# Patient Record
Sex: Female | Born: 1960 | Race: White | Hispanic: No | Marital: Married
Health system: Midwestern US, Academic
[De-identification: ages and names within clinical notes are randomized; demographics above are authoritative.]

---

## 2017-03-02 ENCOUNTER — Encounter: Admit: 2017-03-02 | Discharge: 2017-03-02

## 2017-03-02 MED ORDER — CARVEDILOL 12.5 MG PO TAB
ORAL_TABLET | Freq: Two times a day (BID) | ORAL | 2 refills | 90.00000 days | Status: AC
Start: 2017-03-02 — End: 2017-12-07

## 2017-03-05 ENCOUNTER — Encounter: Admit: 2017-03-05 | Discharge: 2017-03-05

## 2017-03-05 DIAGNOSIS — I10 Essential (primary) hypertension: Principal | ICD-10-CM

## 2017-03-05 NOTE — Telephone Encounter
FLP/BMP lab orders mailed to patient's home today, along with lab request letter.

## 2017-09-03 ENCOUNTER — Encounter: Admit: 2017-09-03 | Discharge: 2017-09-03

## 2017-09-03 MED ORDER — HYDROCHLOROTHIAZIDE 25 MG PO TAB
25 mg | ORAL_TABLET | Freq: Every day | ORAL | 1 refills | 28.00000 days | Status: AC
Start: 2017-09-03 — End: 2018-03-04

## 2017-09-04 ENCOUNTER — Encounter: Admit: 2017-09-04 | Discharge: 2017-09-04

## 2017-09-04 MED ORDER — ATORVASTATIN 10 MG PO TAB
10 mg | ORAL_TABLET | Freq: Every day | ORAL | 1 refills | Status: AC
Start: 2017-09-04 — End: 2018-03-04

## 2017-11-25 ENCOUNTER — Encounter: Admit: 2017-11-25 | Discharge: 2017-11-25

## 2017-11-25 DIAGNOSIS — I10 Essential (primary) hypertension: ICD-10-CM

## 2017-11-25 DIAGNOSIS — E782 Mixed hyperlipidemia: Principal | ICD-10-CM

## 2017-12-03 ENCOUNTER — Encounter: Admit: 2017-12-03 | Discharge: 2017-12-03

## 2017-12-03 DIAGNOSIS — E782 Mixed hyperlipidemia: Principal | ICD-10-CM

## 2017-12-03 DIAGNOSIS — I10 Essential (primary) hypertension: ICD-10-CM

## 2017-12-03 LAB — BASIC METABOLIC PANEL
Lab: 0.7
Lab: 104
Lab: 13
Lab: 9.5
Lab: 96
Lab: 139
Lab: 17
Lab: 26
Lab: 3.9
Lab: 86

## 2017-12-03 LAB — LIPID PROFILE
Lab: 51
Lab: 82
Lab: 116 — ABNORMAL HIGH
Lab: 184

## 2017-12-07 ENCOUNTER — Encounter: Admit: 2017-12-07 | Discharge: 2017-12-07

## 2017-12-07 ENCOUNTER — Ambulatory Visit: Admit: 2017-12-07 | Discharge: 2017-12-08 | Payer: Commercial Managed Care - PPO

## 2017-12-07 DIAGNOSIS — I10 Essential (primary) hypertension: Principal | ICD-10-CM

## 2017-12-07 DIAGNOSIS — K219 Gastro-esophageal reflux disease without esophagitis: ICD-10-CM

## 2017-12-07 DIAGNOSIS — E782 Mixed hyperlipidemia: ICD-10-CM

## 2017-12-07 DIAGNOSIS — E7800 Pure hypercholesterolemia, unspecified: ICD-10-CM

## 2017-12-07 DIAGNOSIS — N644 Mastodynia: ICD-10-CM

## 2017-12-07 DIAGNOSIS — G43909 Migraine, unspecified, not intractable, without status migrainosus: ICD-10-CM

## 2017-12-07 MED ORDER — CARVEDILOL 12.5 MG PO TAB
12.5 mg | ORAL_TABLET | Freq: Two times a day (BID) | ORAL | 3 refills | 90.00000 days | Status: AC
Start: 2017-12-07 — End: 2018-12-17

## 2018-01-13 ENCOUNTER — Ambulatory Visit: Admit: 2018-01-13 | Discharge: 2018-01-13 | Payer: Commercial Managed Care - PPO

## 2018-03-04 ENCOUNTER — Encounter: Admit: 2018-03-04 | Discharge: 2018-03-04

## 2018-03-04 MED ORDER — ATORVASTATIN 10 MG PO TAB
ORAL_TABLET | Freq: Every day | 3 refills | Status: AC
Start: 2018-03-04 — End: 2019-03-22

## 2018-03-04 MED ORDER — HYDROCHLOROTHIAZIDE 25 MG PO TAB
ORAL_TABLET | Freq: Every day | ORAL | 3 refills | 28.00000 days | Status: AC
Start: 2018-03-04 — End: 2019-03-22

## 2018-03-25 ENCOUNTER — Encounter: Admit: 2018-03-25 | Discharge: 2018-03-25

## 2018-12-17 ENCOUNTER — Encounter: Admit: 2018-12-17 | Discharge: 2018-12-17

## 2018-12-17 MED ORDER — CARVEDILOL 12.5 MG PO TAB
ORAL_TABLET | Freq: Two times a day (BID) | ORAL | 0 refills | 90.00000 days | Status: DC
Start: 2018-12-17 — End: 2019-03-22

## 2019-02-04 ENCOUNTER — Encounter: Admit: 2019-02-04 | Discharge: 2019-02-04

## 2019-03-22 ENCOUNTER — Encounter: Admit: 2019-03-22 | Discharge: 2019-03-22

## 2019-03-22 MED ORDER — HYDROCHLOROTHIAZIDE 25 MG PO TAB
ORAL_TABLET | Freq: Every day | ORAL | 0 refills | 28.00000 days | Status: DC
Start: 2019-03-22 — End: 2019-06-20

## 2019-03-22 MED ORDER — CARVEDILOL 12.5 MG PO TAB
ORAL_TABLET | Freq: Two times a day (BID) | ORAL | 0 refills | 90.00000 days | Status: DC
Start: 2019-03-22 — End: 2019-06-20

## 2019-03-22 MED ORDER — ATORVASTATIN 10 MG PO TAB
ORAL_TABLET | Freq: Every day | 0 refills | Status: DC
Start: 2019-03-22 — End: 2019-06-20

## 2019-03-24 ENCOUNTER — Ambulatory Visit: Admit: 2019-03-24 | Discharge: 2019-03-24

## 2019-03-24 ENCOUNTER — Encounter: Admit: 2019-03-24 | Discharge: 2019-03-24

## 2019-03-24 DIAGNOSIS — I10 Essential (primary) hypertension: Secondary | ICD-10-CM

## 2019-03-24 DIAGNOSIS — N644 Mastodynia: Secondary | ICD-10-CM

## 2019-03-24 DIAGNOSIS — E782 Mixed hyperlipidemia: Secondary | ICD-10-CM

## 2019-03-24 DIAGNOSIS — E7800 Pure hypercholesterolemia, unspecified: Secondary | ICD-10-CM

## 2019-03-24 DIAGNOSIS — G43909 Migraine, unspecified, not intractable, without status migrainosus: Secondary | ICD-10-CM

## 2019-03-24 DIAGNOSIS — K219 Gastro-esophageal reflux disease without esophagitis: Secondary | ICD-10-CM

## 2019-03-24 NOTE — Progress Notes
Telehealth Visit Note    Date of Service: 03/24/2019    Subjective:      Obtained patient's verbal consent to treat them and their agreement to [facility] financial policy and NPP via this telehealth visit during the [facility] Emergency       Roberta Manning is a 58 y.o. female.    History of Present Illness    Roberta Manning is a 58 year old female who follows with Dr. Paris Lore.  She has a history of hypertension, migraines, GERD, and insulin resistance on Metformin.   She last saw Dr. Paris Lore in May, 2019.  She postponed her annual follow-up in May, due to COVID-19.  I am seeing her today for a telehealth visit.    She hasn't been monitoring her blood pressure at home. She says the bottom number has been creeping up the last couple of years and is frequently 90s. She says her heart rate on her Apple watch while we were talking was 58 bpm.     Her PCP is [facility] and has had lab work recently.     She denies any chest pain, shortness of breath, palpitations.        Review of Systems   Constitutional: Negative.    HENT: Negative.    Eyes: Negative.    Respiratory: Negative.    Cardiovascular: Negative.    Gastrointestinal: Negative.    Endocrine: Negative.    Genitourinary: Negative.    Musculoskeletal: Negative.    Skin: Negative.    Allergic/Immunologic: Negative.    Neurological: Negative.    Hematological: Negative.    Psychiatric/Behavioral: Negative.          Objective:         ??? acetaminophen (TYLENOL EXTRA STRENGTH) 500 mg tablet Take 500 mg by mouth Every 6 Hours as needed for Pain. For headache    ??? atorvastatin (LIPITOR) 10 mg tablet TAKE 1 TABLET BY MOUTH ONCE DAILY (PLEASE KEEP APPOINTMENT)   ??? carvediloL (COREG) 12.5 mg tablet TAKE 1 TABLET BY MOUTH TWICE DAILY WITH MEALS/FOOD   ??? CHOLECALCIFEROL (VITAMIN D3) (VITAMIN D3 PO) Take  by mouth.   ??? cyanocobalamin (vitamin B-12) (VITAMIN B-12) 1,000 mcg/mL drop Take  by mouth. ??? hydroCHLOROthiazide (HYDRODIURIL) 25 mg tablet Take 1 tablet by mouth once daily   ??? Magnesium 200 mg tab Take 400 mg by mouth daily.   ??? metFORMIN (GLUCOPHAGE) 500 mg tablet Take 500 mg by mouth daily with breakfast.   ??? riboflavin(+) (VITAMIN B-2) 100 mg tab Take 400 mg by mouth daily.   ??? trazodone (DESYREL) 50 mg tablet Take 25 mg by mouth at bedtime daily.     There were no vitals filed for this visit.  There is no height or weight on file to calculate BMI.     Telehealth Patient Reported Vitals     Row Name 03/24/19 1140                Weight:  65.8 kg (145 lb)        Height:  1.575 m (5' 2)        Pain Score:  Zero              Physical Exam      General Appearance: well developed, no acute distress   Breathing: unlabored   Skin: appears pink and dry   Neurologic Exam: neurological assessment grossly intact   Psychiatric:  Oriented, calm, and cooperative  Assessment and Plan:    Hypertension  -I asked her to obtain a [employer] blood pressure cuff and monitor blood pressure at home.  Recommend she take it before she takes her blood pressure medicine and then 2 to 3 hours after she takes her blood pressure medicine for the next couple of weeks so we can get a idea about her control.  -She is on carvedilol 12.5 mg twice daily but her heart rate is 58 so I will be unable to uptitrate this  -she can continue hydrochlorothiazide.  If I needed to add on more medication I would start with losartan 25 mg daily.  She said she had a reaction in the past to clonidine with itchy red skin  -She is going to call in her blood pressures to my chart    Dyslipidemia  -Last lipid profile in our system was Dec 02, 2017 her LDL is 116 HDL was 51, and triglycerides 82.  -She says she had more recent labs drawn at her primary care physician's office.  We appreciate a copy for our records  -He is on atorvastatin 10 mg daily.  She is also prediabetic we recommend aggressive control of her cholesterol to modify her risk factors Plan: She is going to call him with her blood pressure in a couple of weeks.  Would like to try to obtain a copy of her most recent lipid levels from her primary care physician.  Recommend she follow-up with Dr. Paris Lore in 1 year, or sooner if needed.  I would be happy to see her.                Elinor Parkinson, APRN-C  Interventional Cardiology  Pager [phone]            I spent 25 minutes spent on this patient's encounter with counseling and coordination of care taking >50% of the visit.

## 2019-06-18 ENCOUNTER — Encounter: Admit: 2019-06-18 | Discharge: 2019-06-18

## 2019-06-20 MED ORDER — HYDROCHLOROTHIAZIDE 25 MG PO TAB
ORAL_TABLET | Freq: Every day | ORAL | 3 refills | 28.00000 days | Status: AC
Start: 2019-06-20 — End: ?

## 2019-06-20 MED ORDER — CARVEDILOL 12.5 MG PO TAB
ORAL_TABLET | Freq: Two times a day (BID) | ORAL | 3 refills | 90.00000 days | Status: AC
Start: 2019-06-20 — End: ?

## 2019-06-20 MED ORDER — ATORVASTATIN 10 MG PO TAB
10 mg | ORAL_TABLET | Freq: Every day | ORAL | 3 refills | 90.00000 days | Status: AC
Start: 2019-06-20 — End: ?

## 2020-06-24 ENCOUNTER — Encounter: Admit: 2020-06-24 | Discharge: 2020-06-24

## 2020-06-24 MED ORDER — HYDROCHLOROTHIAZIDE 25 MG PO TAB
ORAL_TABLET | Freq: Every day | 0 refills
Start: 2020-06-24 — End: ?

## 2020-06-24 MED ORDER — CARVEDILOL 12.5 MG PO TAB
ORAL_TABLET | Freq: Two times a day (BID) | 0 refills
Start: 2020-06-24 — End: ?

## 2020-06-25 ENCOUNTER — Encounter: Admit: 2020-06-25 | Discharge: 2020-06-25

## 2020-06-25 DIAGNOSIS — I10 Essential (primary) hypertension: Secondary | ICD-10-CM

## 2020-08-22 ENCOUNTER — Encounter: Admit: 2020-08-22 | Discharge: 2020-08-22

## 2020-08-28 ENCOUNTER — Encounter: Admit: 2020-08-28 | Discharge: 2020-08-28 | Payer: BC Managed Care – HMO

## 2020-08-28 MED ORDER — CARVEDILOL 12.5 MG PO TAB
ORAL_TABLET | Freq: Two times a day (BID) | 0 refills
Start: 2020-08-28 — End: ?

## 2020-08-28 MED ORDER — CARVEDILOL 12.5 MG PO TAB
12.5 mg | ORAL_TABLET | Freq: Two times a day (BID) | ORAL | 1 refills | 90.00000 days | Status: AC
Start: 2020-08-28 — End: ?

## 2020-08-28 MED ORDER — HYDROCHLOROTHIAZIDE 25 MG PO TAB
25 mg | ORAL_TABLET | Freq: Every day | ORAL | 1 refills | 28.00000 days | Status: AC
Start: 2020-08-28 — End: ?

## 2020-08-28 MED ORDER — HYDROCHLOROTHIAZIDE 25 MG PO TAB
ORAL_TABLET | Freq: Every day | 0 refills
Start: 2020-08-28 — End: ?

## 2020-09-19 ENCOUNTER — Encounter

## 2020-09-19 DIAGNOSIS — Z79899 Other long term (current) drug therapy: Secondary | ICD-10-CM

## 2020-09-19 DIAGNOSIS — I10 Essential (primary) hypertension: Secondary | ICD-10-CM

## 2020-09-19 DIAGNOSIS — E782 Mixed hyperlipidemia: Secondary | ICD-10-CM

## 2020-09-19 LAB — COMPREHENSIVE METABOLIC PANEL
Lab: 0.7
Lab: 0.7
Lab: 105
Lab: 14
Lab: 140
Lab: 15
Lab: 19
Lab: 24
Lab: 25
Lab: 4
Lab: 4
Lab: 62
Lab: 7.1
Lab: 88
Lab: 9.9
Lab: 95

## 2020-09-19 LAB — LIPID PROFILE
Lab: 110
Lab: 209 — ABNORMAL HIGH
Lab: 22
Lab: 291 — ABNORMAL HIGH
Lab: 60

## 2020-09-20 ENCOUNTER — Encounter

## 2020-09-20 DIAGNOSIS — Z79899 Other long term (current) drug therapy: Secondary | ICD-10-CM

## 2020-09-20 DIAGNOSIS — E782 Mixed hyperlipidemia: Secondary | ICD-10-CM

## 2020-09-20 MED ORDER — ROSUVASTATIN 20 MG PO TAB
20 mg | ORAL_TABLET | Freq: Every day | ORAL | 1 refills | 90.00000 days | Status: AC
Start: 2020-09-20 — End: ?

## 2020-09-20 MED ORDER — EZETIMIBE 10 MG PO TAB
10 mg | ORAL_TABLET | Freq: Every day | ORAL | 1 refills | Status: AC
Start: 2020-09-20 — End: ?

## 2020-09-20 NOTE — Telephone Encounter
Reviewed with Dr. Paris Lore.    Connected with pt.  Informed pt that Dr. Paris Lore wants her to stop taking the Lipitor and start taking Crestor 20mg  daily at night and Zetia 10mg  daily and to recheck FLP and LFT in 3 months. Patient verbalized understanding and does not have any further questions or concerns.  Patient has our contact information for future needs

## 2020-09-20 NOTE — Telephone Encounter
-----   Message from Vevelyn Royals, RN sent at 09/19/2020  9:17 AM CST -----  Elevated lipid profile. Currently taking atorvastatin 10 mg daily. Please advise.

## 2020-10-08 ENCOUNTER — Encounter: Admit: 2020-10-08 | Discharge: 2020-10-08 | Payer: BC Managed Care – HMO

## 2020-10-08 ENCOUNTER — Ambulatory Visit: Admit: 2020-10-08 | Discharge: 2020-10-09 | Payer: BC Managed Care – HMO

## 2020-10-08 DIAGNOSIS — N644 Mastodynia: Secondary | ICD-10-CM

## 2020-10-08 DIAGNOSIS — I10 Essential (primary) hypertension: Secondary | ICD-10-CM

## 2020-10-08 DIAGNOSIS — E782 Mixed hyperlipidemia: Secondary | ICD-10-CM

## 2020-10-08 DIAGNOSIS — K219 Gastro-esophageal reflux disease without esophagitis: Secondary | ICD-10-CM

## 2020-10-08 DIAGNOSIS — E7800 Pure hypercholesterolemia, unspecified: Secondary | ICD-10-CM

## 2020-10-08 DIAGNOSIS — G43909 Migraine, unspecified, not intractable, without status migrainosus: Secondary | ICD-10-CM

## 2020-10-08 NOTE — Progress Notes
Telehealth Visit Note    Date of Service: 10/08/2020    Subjective:      Obtained patient's verbal consent to treat them and their agreement to [facility] financial policy and NPP via this telehealth visit during the [facility] Emergency       Roberta Manning is a 60 y.o. female.    History of Present Illness  Roberta Manning is a 60 year old female with a history of dyslipidemia and hypertension.  Her blood pressures have really been well controlled over the last several years on carvedilol and hydrochlorothiazide.  She does have a family history of coronary artery disease and her cholesterols have been markedly elevated in the past.  She has done very well with atorvastatin however had not been taking it for little while and her repeat cholesterol was quite elevated.  She is now going to be going back on rosuvastatin and Zetia.  She denies any new exertional symptoms of chest discomfort or shortness of breath and really from a cardiovascular standpoint is gotten along well with no other new subjective complaints.       Review of Systems   Constitutional: Negative.    HENT: Negative.    Eyes: Negative.    Respiratory: Negative.    Cardiovascular: Negative.    Gastrointestinal: Negative.    Endocrine: Negative.    Genitourinary: Negative.    Musculoskeletal: Negative.    Skin: Negative.    Allergic/Immunologic: Negative.    Neurological: Negative.    Hematological: Negative.    Psychiatric/Behavioral: Negative.          Objective:         ? carvediloL (COREG) 12.5 mg tablet Take one tablet by mouth twice daily with meals. Take with food.   ? CHOLECALCIFEROL (VITAMIN D3) (VITAMIN D3 PO) Take  by mouth.   ? cyanocobalamin (vitamin B-12) (VITAMIN B-12 PO) Take  by mouth daily.   ? ezetimibe (ZETIA) 10 mg tablet Take one tablet by mouth daily.   ? hydroCHLOROthiazide (HYDRODIURIL) 25 mg tablet Take one tablet by mouth daily.   ? ibuprofen (ADVIL) 200 mg tablet Take 200 mg by mouth as Needed for Pain. Take with food. ? Magnesium 200 mg tab Take 400 mg by mouth daily.   ? metFORMIN (GLUCOPHAGE) 500 mg tablet Take 500 mg by mouth daily with breakfast.   ? riboflavin(+) (VITAMIN B-2) 100 mg tab Take 400 mg by mouth daily.   ? rosuvastatin (CRESTOR) 20 mg tablet Take one tablet by mouth daily.   ? trazodone (DESYREL) 50 mg tablet Take 25 mg by mouth at bedtime daily.          Telehealth Patient Reported Vitals     Row Name 10/08/20 1600                Weight: 61.2 kg (135 lb)        Height: 160 cm (5' 3)        Pain Score: Zero                  Telehealth Body Mass Index: 23.91 at 10/08/2020  4:34 PM    Physical Exam  General Appearance: alert and oriented, no acute distress  Skin: Limited visual exam, however no obvious ulcers or discoloration  Head: normocephalic, symmetric  Eyes: Grossly the EOMI and pupils are normal in size, sclera are clear and without icterus  ENT: unremarkable, nares patent - no obvious visual abnormalities  Neck Veins: neck veins are not visibly  distended  Extremities: no obvious visual lower extremity edema  Muskuloskeletal: no obvious visible deformity  Neurologic Exam: limited visual observation - neurological assessment grossly intact  Mood and Affect: Appropriate         Assessment and Plan:  1. Dyslipidemia-her most recent fasting lipid profile was quite elevated.  We are having her start rosuvastatin 20 mg daily in addition to Zetia.  We will plan to repeat a fasting lipid profile in 3 months and have an LDL target of less than 100 if possible.  2. Hypertension-her blood pressures at home have been under good control.  She has tolerated carvedilol in addition to hydrochlorothiazide.  When I can make any adjustments to her antihypertensive regimen we will have her continue checking her blood pressures at home.    We will plan to repeat a fasting lipid profile in 3 months and see her back in a year.  Problem   Primary Hypertension    a. CT abdomen and pelvis with and without contrast: no evidence of renal artery stenosis;    normal kidney size.                         25 minutes spent on this patient's encounter with counseling and coordination of care taking >50% of the visit.

## 2020-10-14 ENCOUNTER — Encounter: Admit: 2020-10-14 | Discharge: 2020-10-14 | Payer: BC Managed Care – HMO

## 2020-11-03 ENCOUNTER — Encounter: Admit: 2020-11-03 | Discharge: 2020-11-03 | Payer: BC Managed Care – HMO

## 2020-11-03 MED ORDER — CARVEDILOL 12.5 MG PO TAB
ORAL_TABLET | Freq: Two times a day (BID) | 0 refills
Start: 2020-11-03 — End: ?

## 2020-12-07 ENCOUNTER — Encounter: Admit: 2020-12-07 | Discharge: 2020-12-07 | Payer: BC Managed Care – HMO

## 2020-12-07 MED ORDER — CARVEDILOL 12.5 MG PO TAB
ORAL_TABLET | Freq: Two times a day (BID) | ORAL | 3 refills | 90.00000 days | Status: AC
Start: 2020-12-07 — End: ?

## 2020-12-25 ENCOUNTER — Encounter: Admit: 2020-12-25 | Discharge: 2020-12-25 | Payer: BC Managed Care – HMO

## 2021-01-02 ENCOUNTER — Encounter: Admit: 2021-01-02 | Discharge: 2021-01-02 | Payer: BC Managed Care – HMO

## 2021-01-18 ENCOUNTER — Encounter: Admit: 2021-01-18 | Discharge: 2021-01-18 | Payer: BC Managed Care – HMO

## 2021-01-18 DIAGNOSIS — Z79899 Other long term (current) drug therapy: Secondary | ICD-10-CM

## 2021-01-18 DIAGNOSIS — E782 Mixed hyperlipidemia: Secondary | ICD-10-CM

## 2021-01-18 LAB — LIPID PROFILE
CHOLESTEROL/HDL %: 2
CHOLESTEROL: 135
HDL: 56
LDL: 66
TRIGLYCERIDES: 66
VLDL: 13

## 2021-01-18 LAB — LIVER FUNCTION PANEL
ALBUMIN: 4
ALK PHOSPHATASE: 68
DIRECT BILIRUBIN: 0.3
TOTAL BILIRUBIN: 0.8

## 2021-02-14 ENCOUNTER — Encounter: Admit: 2021-02-14 | Discharge: 2021-02-14 | Payer: BC Managed Care – HMO

## 2021-02-14 MED ORDER — HYDROCHLOROTHIAZIDE 25 MG PO TAB
ORAL_TABLET | Freq: Every day | ORAL | 0 refills | 28.00000 days | Status: AC
Start: 2021-02-14 — End: ?

## 2021-04-10 ENCOUNTER — Encounter: Admit: 2021-04-10 | Discharge: 2021-04-10 | Payer: BC Managed Care – HMO

## 2021-04-10 MED ORDER — EZETIMIBE 10 MG PO TAB
ORAL_TABLET | Freq: Every day | 0 refills
Start: 2021-04-10 — End: ?

## 2021-04-10 MED ORDER — CARVEDILOL 12.5 MG PO TAB
ORAL_TABLET | Freq: Two times a day (BID) | 0 refills
Start: 2021-04-10 — End: ?

## 2021-04-10 MED ORDER — ROSUVASTATIN 20 MG PO TAB
ORAL_TABLET | Freq: Every day | 0 refills
Start: 2021-04-10 — End: ?

## 2021-04-24 ENCOUNTER — Encounter: Admit: 2021-04-24 | Discharge: 2021-04-24 | Payer: BC Managed Care – HMO

## 2021-05-20 ENCOUNTER — Encounter: Admit: 2021-05-20 | Discharge: 2021-05-20 | Payer: BC Managed Care – HMO

## 2021-05-20 MED ORDER — CARVEDILOL 12.5 MG PO TAB
ORAL_TABLET | Freq: Two times a day (BID) | ORAL | 0 refills | 90.00000 days | Status: AC
Start: 2021-05-20 — End: ?

## 2021-05-20 MED ORDER — HYDROCHLOROTHIAZIDE 25 MG PO TAB
ORAL_TABLET | Freq: Every day | ORAL | 0 refills | 28.00000 days | Status: AC
Start: 2021-05-20 — End: ?

## 2021-06-22 ENCOUNTER — Encounter: Admit: 2021-06-22 | Discharge: 2021-06-22 | Payer: BC Managed Care – HMO

## 2021-06-22 MED ORDER — CARVEDILOL 12.5 MG PO TAB
ORAL_TABLET | Freq: Two times a day (BID) | 0 refills
Start: 2021-06-22 — End: ?

## 2021-07-18 ENCOUNTER — Encounter: Admit: 2021-07-18 | Discharge: 2021-07-18 | Payer: BC Managed Care – HMO

## 2021-07-18 MED ORDER — EZETIMIBE 10 MG PO TAB
ORAL_TABLET | Freq: Every day | 0 refills | Status: AC
Start: 2021-07-18 — End: ?

## 2021-07-18 MED ORDER — HYDROCHLOROTHIAZIDE 25 MG PO TAB
ORAL_TABLET | Freq: Every day | ORAL | 0 refills | 28.00000 days | Status: AC
Start: 2021-07-18 — End: ?

## 2021-07-18 MED ORDER — ROSUVASTATIN 20 MG PO TAB
ORAL_TABLET | Freq: Every day | ORAL | 0 refills | 90.00000 days | Status: AC
Start: 2021-07-18 — End: ?

## 2021-10-21 ENCOUNTER — Encounter: Admit: 2021-10-21 | Discharge: 2021-10-21 | Payer: BC Managed Care – HMO

## 2021-10-21 ENCOUNTER — Ambulatory Visit: Admit: 2021-10-21 | Discharge: 2021-10-21 | Payer: BC Managed Care – HMO

## 2021-10-21 DIAGNOSIS — N644 Mastodynia: Secondary | ICD-10-CM

## 2021-10-21 DIAGNOSIS — I10 Essential (primary) hypertension: Secondary | ICD-10-CM

## 2021-10-21 DIAGNOSIS — K219 Gastro-esophageal reflux disease without esophagitis: Secondary | ICD-10-CM

## 2021-10-21 DIAGNOSIS — E782 Mixed hyperlipidemia: Secondary | ICD-10-CM

## 2021-10-21 DIAGNOSIS — R0989 Other specified symptoms and signs involving the circulatory and respiratory systems: Secondary | ICD-10-CM

## 2021-10-21 DIAGNOSIS — G43909 Migraine, unspecified, not intractable, without status migrainosus: Secondary | ICD-10-CM

## 2021-10-21 DIAGNOSIS — E7800 Pure hypercholesterolemia, unspecified: Secondary | ICD-10-CM

## 2021-10-21 MED ORDER — HYDROCHLOROTHIAZIDE 25 MG PO TAB
25 mg | ORAL_TABLET | Freq: Every day | ORAL | 3 refills | 28.00000 days | Status: AC
Start: 2021-10-21 — End: ?

## 2021-10-21 MED ORDER — EZETIMIBE 10 MG PO TAB
10 mg | ORAL_TABLET | Freq: Every day | ORAL | 3 refills | Status: AC
Start: 2021-10-21 — End: ?

## 2021-10-21 MED ORDER — ROSUVASTATIN 20 MG PO TAB
20 mg | ORAL_TABLET | Freq: Every day | ORAL | 3 refills | 90.00000 days | Status: AC
Start: 2021-10-21 — End: ?

## 2021-10-21 MED ORDER — CARVEDILOL 12.5 MG PO TAB
12.5 mg | ORAL_TABLET | Freq: Two times a day (BID) | ORAL | 3 refills | 90.00000 days | Status: AC
Start: 2021-10-21 — End: ?

## 2021-10-21 NOTE — Patient Instructions
We will plan to get your cholesterol checked in June    Keep track of your blood pressure    We will check back in a year

## 2021-12-26 ENCOUNTER — Encounter: Admit: 2021-12-26 | Discharge: 2021-12-26 | Payer: BC Managed Care – HMO

## 2021-12-26 DIAGNOSIS — E782 Mixed hyperlipidemia: Secondary | ICD-10-CM

## 2021-12-26 DIAGNOSIS — I10 Essential (primary) hypertension: Secondary | ICD-10-CM

## 2022-01-15 ENCOUNTER — Encounter: Admit: 2022-01-15 | Discharge: 2022-01-15 | Payer: BC Managed Care – HMO

## 2022-03-26 ENCOUNTER — Encounter: Admit: 2022-03-26 | Discharge: 2022-03-26 | Payer: BC Managed Care – HMO

## 2022-03-26 DIAGNOSIS — I10 Essential (primary) hypertension: Secondary | ICD-10-CM

## 2022-03-26 DIAGNOSIS — E782 Mixed hyperlipidemia: Secondary | ICD-10-CM

## 2022-03-26 LAB — COMPREHENSIVE METABOLIC PANEL
ALBUMIN: 3.9
ALK PHOSPHATASE: 52
ALT: 89
ANION GAP: 1.4
AST: 36
BLD UREA NITROGEN: 9
CALCIUM: 9.4
CHLORIDE: 104
CO2: 27
CREATININE: 0.7
GFR ESTIMATED: 82
GLUCOSE,PANEL: 101
TOTAL BILIRUBIN: 0.7
TOTAL PROTEIN: 6.6

## 2022-03-26 LAB — LIPID PROFILE
CHOLESTEROL: 141
HDL: 62
TRIGLYCERIDES: 61

## 2022-03-31 ENCOUNTER — Encounter: Admit: 2022-03-31 | Discharge: 2022-03-31 | Payer: BC Managed Care – HMO

## 2022-05-13 ENCOUNTER — Encounter: Admit: 2022-05-13 | Discharge: 2022-05-13 | Payer: BC Managed Care – HMO

## 2022-05-23 ENCOUNTER — Encounter: Admit: 2022-05-23 | Discharge: 2022-05-23 | Payer: BC Managed Care – HMO

## 2022-11-05 ENCOUNTER — Encounter: Admit: 2022-11-05 | Discharge: 2022-11-05 | Payer: BC Managed Care – HMO

## 2022-11-05 ENCOUNTER — Ambulatory Visit: Admit: 2022-11-05 | Discharge: 2022-11-05 | Payer: BC Managed Care – HMO

## 2022-11-05 DIAGNOSIS — R0989 Other specified symptoms and signs involving the circulatory and respiratory systems: Secondary | ICD-10-CM

## 2022-11-05 DIAGNOSIS — K219 Gastro-esophageal reflux disease without esophagitis: Secondary | ICD-10-CM

## 2022-11-05 DIAGNOSIS — I10 Essential (primary) hypertension: Secondary | ICD-10-CM

## 2022-11-05 DIAGNOSIS — E782 Mixed hyperlipidemia: Secondary | ICD-10-CM

## 2022-11-05 DIAGNOSIS — E7800 Pure hypercholesterolemia, unspecified: Secondary | ICD-10-CM

## 2022-11-05 DIAGNOSIS — N644 Mastodynia: Secondary | ICD-10-CM

## 2022-11-05 DIAGNOSIS — G43909 Migraine, unspecified, not intractable, without status migrainosus: Secondary | ICD-10-CM

## 2022-11-05 LAB — LIVER FUNCTION PANEL
ALBUMIN: 4.1 g/dL (ref 3.5–5.0)
ALK PHOSPHATASE: 62 U/L (ref 25–110)
ALT: 39 U/L (ref 7–56)
AST: 28 U/L (ref 7–40)
TOTAL BILIRUBIN: 0.5 mg/dL (ref 0.3–1.2)
TOTAL PROTEIN: 6.8 g/dL (ref 6.0–8.0)

## 2022-11-05 MED ORDER — ROSUVASTATIN 20 MG PO TAB
10 mg | ORAL_TABLET | Freq: Every day | ORAL | 3 refills | 90.00000 days | Status: AC
Start: 2022-11-05 — End: ?

## 2022-11-05 NOTE — Progress Notes
Date of Service: 11/05/2022    Roberta Manning is a 62 y.o. female.       HPI     Roberta Manning is a 62 year old female who I am seeing in the office today for a history of hypertension and dyslipidemia.  Today in the office, her blood pressures of been very well-controlled on her current regimen.  From a symptom standpoint, she denies any progressive symptoms of shortness of breath or chest discomfort and really has been doing well overall.  Last fall, she did have liver function testing which demonstrated a slight increase in her AST and ALTs.  We are can plan to repeat her blood work today in the office.  The only other complaint that she is described as some nondescript itching but no rash.         Vitals:    11/05/22 0906   BP: 126/76   BP Source: Arm, Right Upper   Pulse: 62   PainSc: Zero   Weight: 71.7 kg (158 lb)   Height: 160 cm (5' 3)     Body mass index is 27.99 kg/m?Marland Kitchen     Past Medical History  Patient Active Problem List    Diagnosis Date Noted    Mixed hyperlipidemia 09/18/2016    Bilateral Breast pain-surgical 11/03/2010     DIAGNOSIS:  Breast pain   HISTORY:  Ms. Ballester is a 62 yo female who presented to the [facility] on  01/14/2011 for further evaluation of her breast pain. Bilateral diagnostic mammogram on 12/17/10 [facility]) showed no suspicious findings; small benign appearing nodes were noted in both axillae.  PERTINENT PMH:  HTN, migraines, GERD  OB/GYN HISTORY:    FAMILY HISTORY:    PHYSICAL EXAM on PRESENTATION:    REFERRED BY:  Melissa Huntington        Primary hypertension 02/08/2009     a. CT abdomen and pelvis with and without contrast: no evidence of renal artery stenosis;    normal kidney size.      Migraine headache 02/08/2009     a.  [facility] in cervical spine.          b. Seen by Neurology and is presently undergoing physical therapy on her cervical spine.       GERD (gastroesophageal reflux disease) 02/08/2009         Review of Systems   Constitutional: Negative.   HENT: Negative.     Eyes: Negative.    Cardiovascular: Negative.    Respiratory: Negative.     Endocrine: Negative.    Hematologic/Lymphatic: Negative.    Skin: Negative.    Musculoskeletal: Negative.    Gastrointestinal: Negative.    Genitourinary: Negative.    Neurological: Negative.    Psychiatric/Behavioral: Negative.     Allergic/Immunologic: Negative.        Physical Exam  Physical Exam   General Appearance: alert and oriented, no acute distress  Skin: warm, moist, no ulcers  Head: normocephalic, symmetric  Eyes: EOMI, PERRL, sclera are clear and without icterus  ENT: unremarkable, nares patent  Neck Veins: neck veins are flat, neck veins are not distended  Carotid Arteries: normal carotid upstroke bilaterally, no bruits  Chest Inspection: chest is normal in appearance  Auscultation/Percussion: lungs clear to auscultation, no rales, rhonchi, wheezes or friction rub appreciated  Cardiac Rhythm: regular rhythm and normal rate  Cardiac Auscultation: Normal S1 & S2, no S3 or S4,  no rub - normal pmi  Murmurs: no cardiac murmurs   Extremities: no lower extremity edema bilaterally; 2+ symmetric distal pulses  Muskuloskeletal: no obvious deformity  Neurologic Exam: neurological assessment grossly intact  Mood and Affect: Appropriate        Cardiovascular Health Factors  Vitals BP Readings from Last 3 Encounters:   11/05/22 126/76   10/21/21 (!) 150/100   12/07/17 (!) 152/92     Wt Readings from Last 3 Encounters:   11/05/22 71.7 kg (158 lb)   10/21/21 66.6 kg (146 lb 12.8 oz)   12/07/17 68 kg (150 lb)     BMI Readings from Last 3 Encounters:   11/05/22 27.99 kg/m?   10/21/21 26.00 kg/m?   12/07/17 26.57 kg/m?      Smoking Social History     Tobacco Use   Smoking Status Never   Smokeless Tobacco Never      Lipid Profile Cholesterol   Date Value Ref Range Status   03/26/2022 141  Final     HDL   Date Value Ref Range Status   03/26/2022 62 Final     LDL   Date Value Ref Range Status   03/26/2022 67  Final     Triglycerides   Date Value Ref Range Status   03/26/2022 61  Final      Blood Sugar Hemoglobin A1C   Date Value Ref Range Status   07/18/2015 5.5  Final     Glucose   Date Value Ref Range Status   03/26/2022 101  Final   09/19/2020 95  Final   12/02/2017 96  Final   03/19/2006 99 70 - 110 MG/DL Final          Problems Addressed Today  Encounter Diagnoses   Name Primary?    Mixed hyperlipidemia Yes    Cardiovascular symptoms     Primary hypertension        Assessment and Plan     Hypertension-her blood pressures currently well-controlled on the combination of carvedilol and hydrochlorothiazide.  Will plan to have her continue checking her blood pressures at home and not make any adjustments currently.  Dyslipidemia-her cholesterol has been well-controlled on the combination of rosuvastatin 20 mg daily in addition to Zetia.  We did obtain liver function testing today and we will plan to reduce her rosuvastatin to 10 mg daily to see if this is contributing to her itching.    We will plan to see her back in a year or sooner if needed.         Current Medications (including today's revisions)   carvediloL (COREG) 12.5 mg tablet Take one tablet by mouth twice daily. with meals    CHOLECALCIFEROL (VITAMIN D3) (VITAMIN D3 PO) Take  by mouth.    cyanocobalamin (vitamin B-12) (VITAMIN B-12 PO) Take  by mouth daily.    ezetimibe (ZETIA) 10 mg tablet Take one tablet by mouth daily.    hydroCHLOROthiazide (HYDRODIURIL) 25 mg tablet Take one tablet by mouth daily.    ibuprofen (ADVIL) 200 mg tablet Take one tablet by mouth as Needed for Pain. Take with food.    Magnesium 200 mg tab Take two tablets by mouth daily.    metFORMIN (GLUCOPHAGE) 500 mg tablet Take one tablet by mouth daily with breakfast.    riboflavin(+) (VITAMIN B-2) 100 mg tab Take four tablets by mouth daily.    rosuvastatin (CRESTOR) 20 mg tablet Take one-half tablet by mouth daily. sertraline (ZOLOFT) 50 mg tablet  Take one tablet by mouth daily.    trazodone (DESYREL) 50 mg tablet Take one-half tablet by mouth at bedtime daily.

## 2022-11-28 ENCOUNTER — Encounter: Admit: 2022-11-28 | Discharge: 2022-11-28 | Payer: BC Managed Care – HMO

## 2022-11-28 MED ORDER — EZETIMIBE 10 MG PO TAB
10 mg | ORAL_TABLET | Freq: Every day | ORAL | 3 refills | Status: AC
Start: 2022-11-28 — End: ?

## 2022-12-26 ENCOUNTER — Encounter: Admit: 2022-12-26 | Discharge: 2022-12-26 | Payer: BC Managed Care – HMO

## 2022-12-26 MED ORDER — HYDROCHLOROTHIAZIDE 25 MG PO TAB
25 mg | ORAL_TABLET | Freq: Every day | ORAL | 1 refills | 28.00000 days | Status: AC
Start: 2022-12-26 — End: ?

## 2022-12-26 MED ORDER — CARVEDILOL 12.5 MG PO TAB
12.5 mg | ORAL_TABLET | Freq: Two times a day (BID) | ORAL | 1 refills | 90.00000 days | Status: AC
Start: 2022-12-26 — End: ?

## 2023-03-15 IMAGING — MG MM mammogram 3D screen bilat
8 series · 10 of 24 positions shown · non-contrast
Comparison: none

[R CC]
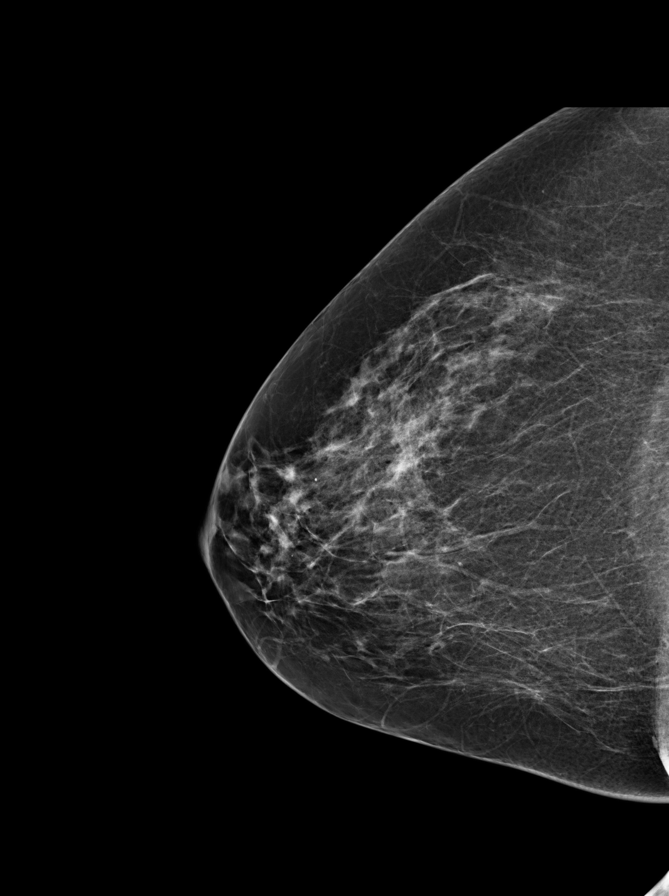

[Series 4: (PERSON_NAME)_TOMO R-CC PRIME, EMPIRE_C. tomo · 3 of 68 frames shown]
[frame 22/68]
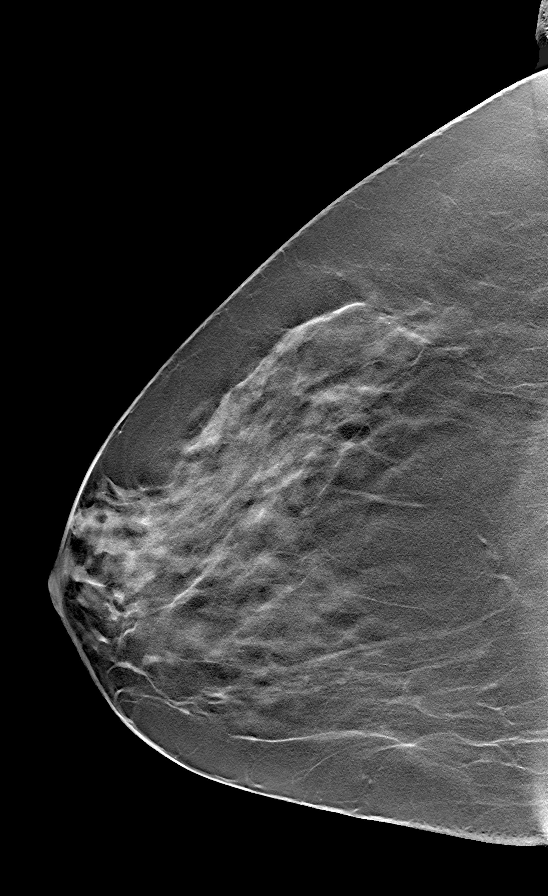
[frame 35/68]
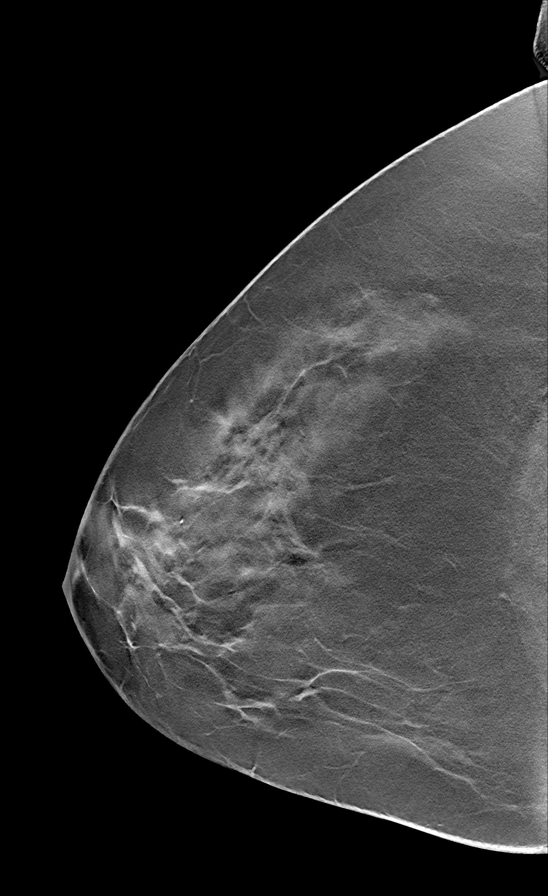
[frame 47/68]
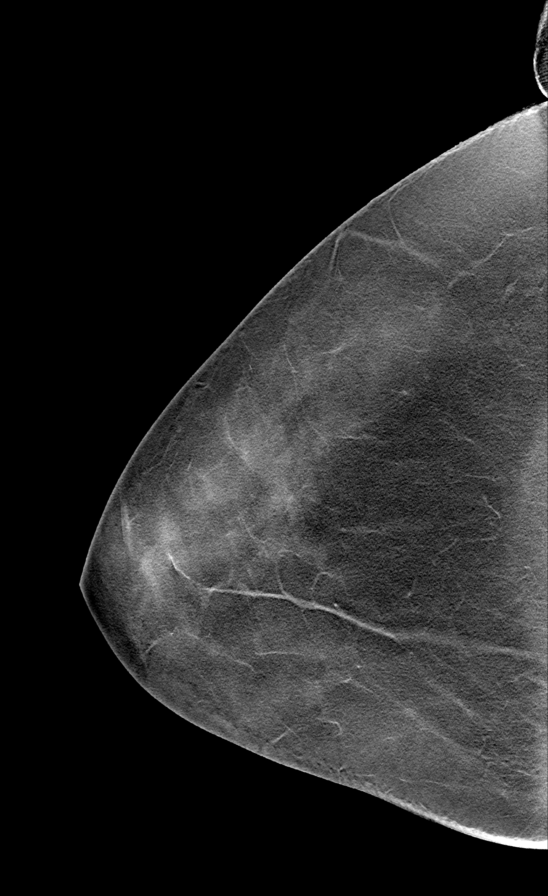

[L CC]
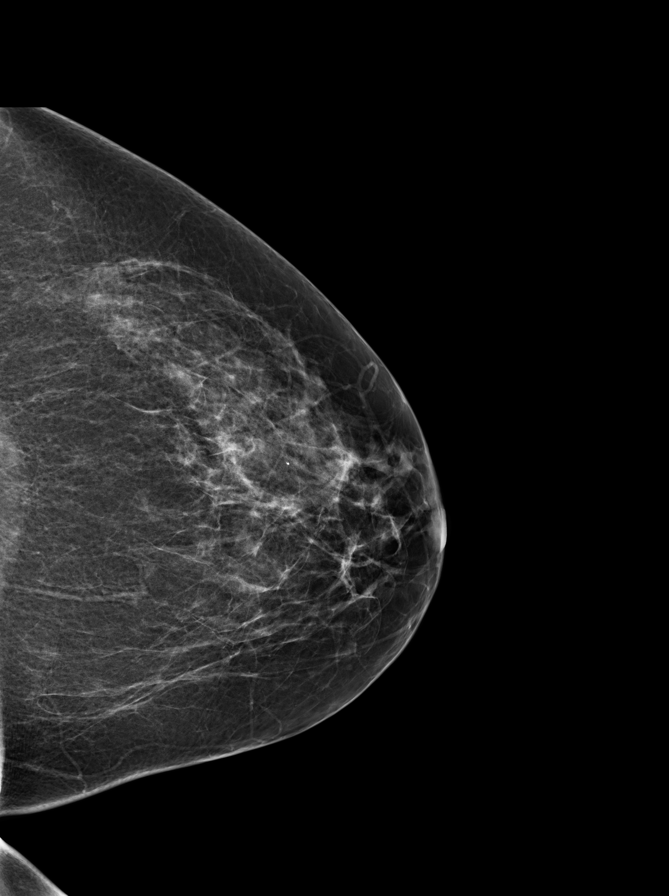

[[PERSON_NAME]_TOMO L-CC PRIME, EMPIRE_C. tomo · tomo slice 33/65.0]
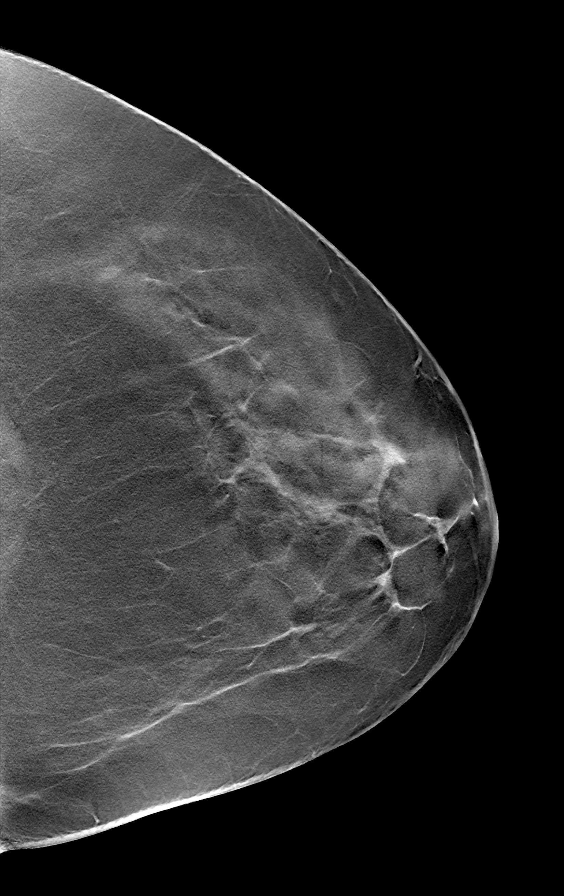

[R MLO]
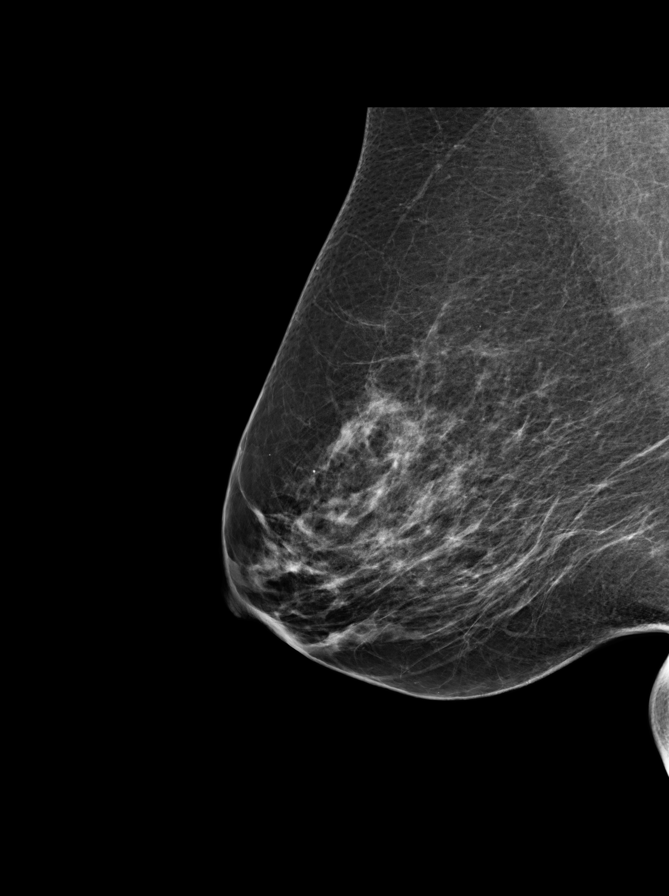

[[PERSON_NAME]_TOMO R-MLO PRIME, EMPIRE_C tomo · tomo slice 38/75.0]
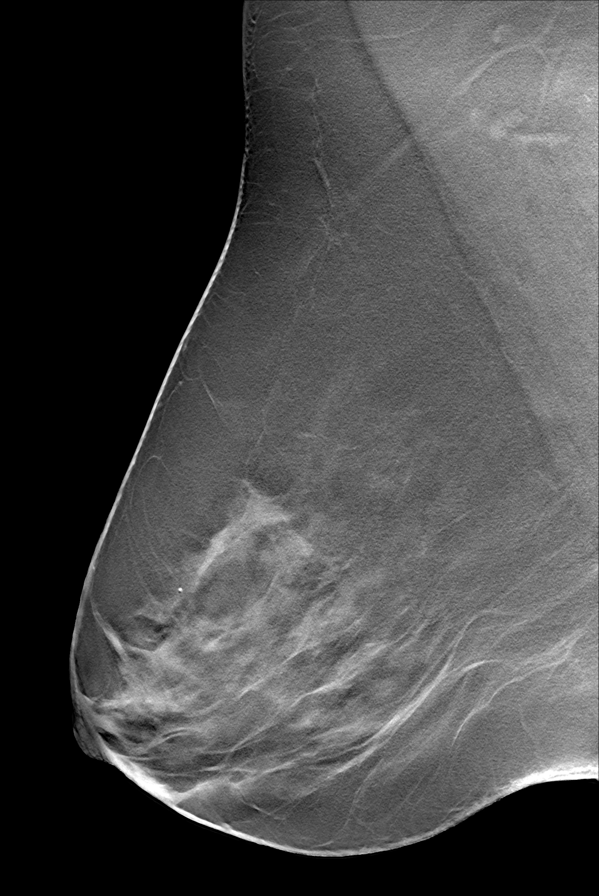

[L MLO]
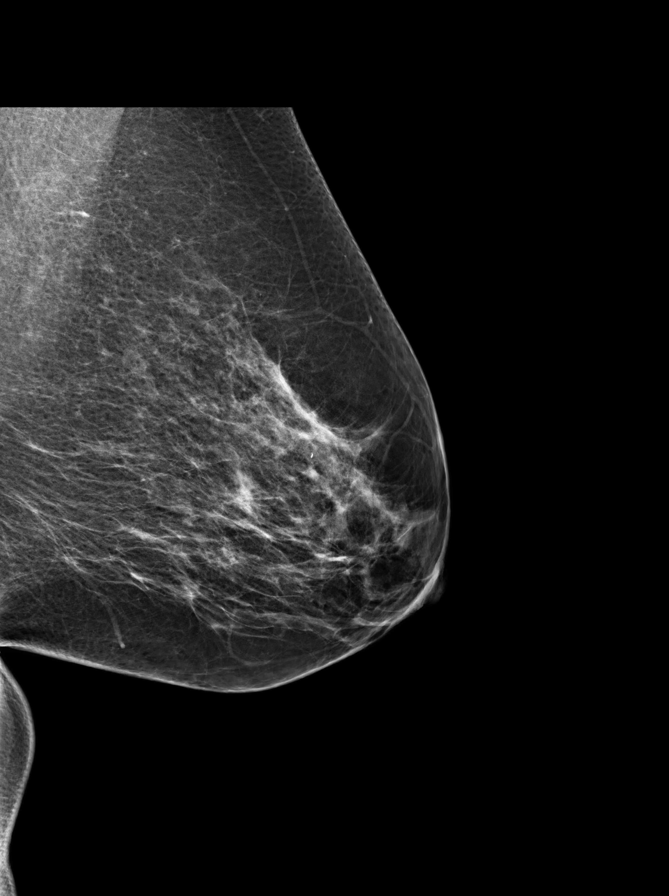

[[PERSON_NAME]_TOMO L-MLO PRIME, EMPIRE_C tomo · tomo slice 36/71.0]
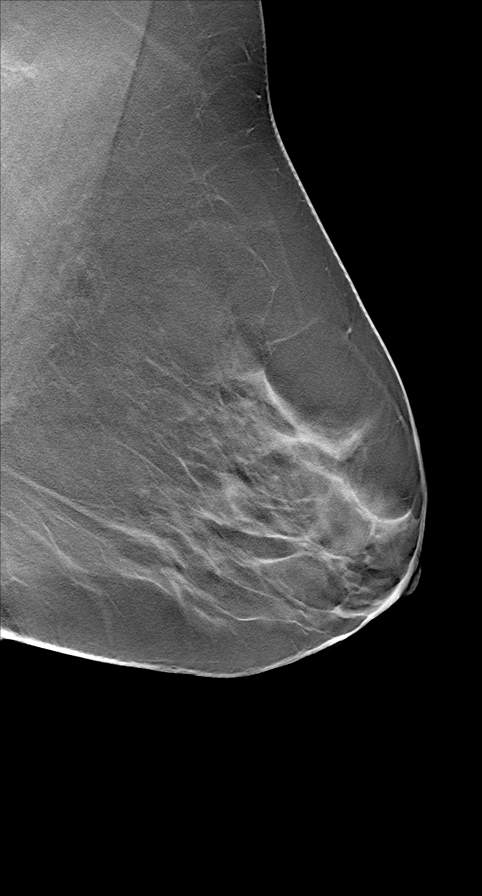

[10 of 24 positions shown; findings below may reference images not displayed]

EXAM

MM mammogram 3D screen bilat

INDICATION

screen
TC SCORE: LIFETIME RISK 3.2%.  SCREENING.  AB (3D) PRIORS: 9399.

TECHNIQUE

2D and tomosynthesis digital craniocaudal and mediolateral oblique views were obtained of both
breasts.

COMPARISONS

11/23/20

FINDINGS

Heterogenous breast tissue density, which may obscure small masses.

No suspicious microcalcification, architectural distortion, or spiculated mass.

IMPRESSION

BI-RADS 1, NEGATIVE.

A reminder letter will be sent.

Tech Notes:

## 2023-03-30 ENCOUNTER — Encounter: Admit: 2023-03-30 | Discharge: 2023-03-30 | Payer: BC Managed Care – HMO

## 2023-04-21 ENCOUNTER — Encounter: Admit: 2023-04-21 | Discharge: 2023-04-21 | Payer: BC Managed Care – HMO

## 2023-04-27 ENCOUNTER — Encounter: Admit: 2023-04-27 | Discharge: 2023-04-27 | Payer: BC Managed Care – HMO

## 2023-04-27 NOTE — Progress Notes
Date of Service: 04/28/2023    Referring Provider:   Rockwell [place], [place]      Subjective:             Roberta Manning is a 62 y.o. female.    History of Present Illness  Roberta Manning is referred for further evaluation of arthralgia.  The history is obtained from the patient and review of the electronic medical record.  She has a history of migraine vitamin D and B12 deficiency, hypertension, dyslipidemia, hypothyroidism, gluten intolerence, no smoking, alcohol or drug    Chronic low back pain for years, with sciatica, when she stands up. Morning stiffness for more than an hour. Wakes her up at night. Worse towards end of the day.  Right shoulder painful, with activity. Has been there for 6 months.   Elbows both tender, 3 months ago, mostly when leans on it. No swelling.  Hands achy for 1 year, getting worse. Hard to make a fist. All MCP/PIP/DIP, not wrist. Pain at rest. Better with activity. Morning stiffness for hours. Ring is getting stuck.  Knee pain for a year, worsening in 3 months, when tries to stand up or walk. No pain at rest.   Feet have been bothersome for years, plantar fasciitis, CSI made it worse. Chiropracter helped. Had peripheral neuropathy per EMG.         No hx of ocular inflammation  Has Dry eye  Has Dry mouth  Dry skin  Dry groin  No nasal or mouth ulcers  No hearing loss  No raynauds  No skin rashes  No SOB  No CP  Has sinus issues  Cough with yellow sputum  No GI sxs  No GU sxs  No fever or weight loss    Not UTD on mammogram or coloscopy     Not UTD vaccination       Family hx RA, gout    Laboratory Summary:  RF 69  Ccp >250  Esr neg  Alb 3.8  Lfts wnl  Ana neg  CBC and CMP WNL    Radiology Summary:  X-ray showed chondrocalcinosis of the menisci and small knee effusion     Treatment History:  NA    Contraception:      Infection Monitoring:  TB Screening  No results found for: TSPOTTB, QUANTIFERTB    Hepatitis B Screening  No results found for: HEPBSAG  No results found for: HEPBCTOTAL  No results found for: HEPBSAB    Immunization         Past Medical History:   Diagnosis Date    Breast pain 11/2008    GERD (gastroesophageal reflux disease) 02/08/2009    High cholesterol     Hypertension, poor control 02/08/2009    Migraine headache 02/08/2009     Surgical History:   Procedure Laterality Date    TAH AND BSO  1997    endometriosis    CHOLECYSTECTOMY  2000     Family History   Problem Relation Name Age of Onset    Cancer Mother          skin    Cancer-Breast Other maternal great grandmoth     Cancer Maternal Grandmother          skin     Social History     Socioeconomic History    Marital status: Married   Tobacco Use    Smoking status: Never    Smokeless tobacco: Never   Substance and Sexual Activity  Alcohol use: Yes     Comment: occasional     Drug use: No           Objective:          carvediloL (COREG) 12.5 mg tablet TAKE 1 TABLET BY MOUTH TWICE DAILY WITH MEALS    CHOLECALCIFEROL (VITAMIN D3) (VITAMIN D3 PO) Take  by mouth.    cyanocobalamin (vitamin B-12) (VITAMIN B-12 PO) Take  by mouth daily.    ezetimibe (ZETIA) 10 mg tablet Take 1 tablet by mouth once daily    hydroCHLOROthiazide (HYDRODIURIL) 25 mg tablet Take 1 tablet by mouth once daily    ibuprofen (ADVIL) 200 mg tablet Take one tablet by mouth as Needed for Pain. Take with food.    Magnesium 200 mg tab Take two tablets by mouth daily.    metFORMIN (GLUCOPHAGE) 500 mg tablet Take one tablet by mouth daily with breakfast.    riboflavin(+) (VITAMIN B-2) 100 mg tab Take four tablets by mouth daily.    rosuvastatin (CRESTOR) 20 mg tablet Take one-half tablet by mouth daily.    sertraline (ZOLOFT) 50 mg tablet Take one tablet by mouth daily.    trazodone (DESYREL) 50 mg tablet Take one-half tablet by mouth at bedtime daily.     There were no vitals filed for this visit.  There is no height or weight on file to calculate BMI.     Physical Exam  General: Alert and oriented, no acute distress.  Eye: Clear conjunctiva and lids.  HEENT: Moist mucous membranes with no oral or nasal ulcers.  Lymph: No cervical or supraclavicular lymphadenopathy.  CV: Regular rate and rhythm; no murmur/gallop/rub.  Vessels: Normal radial pulses.  Pulm: Clear to auscultation bilaterally.  Abdomen: Soft, nontender, nondistended.    Skin: No malar rash, heliotrope rash, Gottron's papules, sclerodactyly, telangiectasias, or psoriatic plaques.  MSK: No synovitis appreciated of the sternoclavicular joints, acromioclavicular joints, shoulders, elbows, knees, and ankles.  Right shoulder limited ROM abduction and FF  Elbows WNL  Wirst WNL  Hands warm  Right 2nd PIP tender  Left mcp 2nd, 3rd tender  Left 5th pip tender  Right sided trochanteric bursitis  Knees mild effusion but not inflamted  Plantar fascitis in both feet       Assessment and Plan:  Nakina Boakye is referred for further evaluation of arthralgia. She was found to have strongly positive CCP and RF. Knee xray with CPPD and small effusion.  On exam, bilateral hands with probable synovitis mostly in the MCPs and PIPs. Her shoulder and knee pain is likely mechanical in nature.  Discussed getting hand xray to try and differentiate between Rheumatoid arthritis and Pseudogout.   Discussed one week of prednisone both for symptomatic relief and as a diagnostic tool to determine if she does have inflammation in the joints given the subtle findings on exam.   Once test/xray results are back, will decide on the most appropriate therapy.     Recommendations:  Blood work  Xray hands, shoulder and left knee  Voltaren gel for right trochanteric bursitis   PT for shoulder RTC, knee pain and plantar fasciitis   Prednisone 20 mg once daily for 7 days and patient to let me know how she feels  Needs UTD mammogram and colonoscopy     RCT in 2 months       Roberta Dredge Doumeth,MD  Assistant Professor   [facility]  Division of Rheumatology  [address] [place]  [address]  [place], [place] [address]

## 2023-04-28 ENCOUNTER — Encounter: Admit: 2023-04-28 | Discharge: 2023-04-28 | Payer: BC Managed Care – HMO

## 2023-04-28 ENCOUNTER — Ambulatory Visit: Admit: 2023-04-28 | Discharge: 2023-04-28 | Payer: BC Managed Care – HMO

## 2023-04-28 DIAGNOSIS — G43909 Migraine, unspecified, not intractable, without status migrainosus: Secondary | ICD-10-CM

## 2023-04-28 DIAGNOSIS — M069 Rheumatoid arthritis, unspecified: Secondary | ICD-10-CM

## 2023-04-28 DIAGNOSIS — M12811 Other specific arthropathies, not elsewhere classified, right shoulder: Secondary | ICD-10-CM

## 2023-04-28 DIAGNOSIS — E7800 Pure hypercholesterolemia, unspecified: Secondary | ICD-10-CM

## 2023-04-28 DIAGNOSIS — M7061 Trochanteric bursitis, right hip: Secondary | ICD-10-CM

## 2023-04-28 DIAGNOSIS — M0579 Rheumatoid arthritis with rheumatoid factor of multiple sites without organ or systems involvement: Secondary | ICD-10-CM

## 2023-04-28 DIAGNOSIS — I10 Essential (primary) hypertension: Secondary | ICD-10-CM

## 2023-04-28 DIAGNOSIS — K219 Gastro-esophageal reflux disease without esophagitis: Secondary | ICD-10-CM

## 2023-04-28 DIAGNOSIS — G629 Polyneuropathy, unspecified: Secondary | ICD-10-CM

## 2023-04-28 DIAGNOSIS — N644 Mastodynia: Secondary | ICD-10-CM

## 2023-04-28 DIAGNOSIS — J302 Other seasonal allergic rhinitis: Secondary | ICD-10-CM

## 2023-04-28 LAB — ANTI SSA ANTI SSB AB

## 2023-04-28 LAB — HEPATITIS B SURFACE AG

## 2023-04-28 LAB — C REACTIVE PROTEIN (CRP): C-REACTIVE PROTEIN: 0 mg/dL (ref <1.0)

## 2023-04-28 LAB — HEPATITIS C ANTIBODY W REFLEX HCV PCR QUANT

## 2023-04-28 LAB — URIC ACID: URIC ACID: 4.7 mg/dL (ref 2.0–7.0)

## 2023-04-28 LAB — HEPATITIS B SURFACE AB: HEP B SURFACE ABY: NEGATIVE

## 2023-04-28 MED ORDER — PREDNISONE 20 MG PO TAB
20 mg | ORAL_TABLET | Freq: Every day | ORAL | 0 refills | Status: AC
Start: 2023-04-28 — End: ?

## 2023-05-11 ENCOUNTER — Encounter: Admit: 2023-05-11 | Discharge: 2023-05-11 | Payer: BC Managed Care – HMO

## 2023-05-15 ENCOUNTER — Encounter: Admit: 2023-05-15 | Discharge: 2023-05-15 | Payer: BC Managed Care – HMO

## 2023-05-15 NOTE — Telephone Encounter
Physical Therapy POT received and reviewed by provider. Signed copy faxed to [facility] [phone]. Copy placed in on board for scanning

## 2023-05-18 ENCOUNTER — Encounter: Admit: 2023-05-18 | Discharge: 2023-05-18 | Payer: BC Managed Care – HMO

## 2023-05-18 ENCOUNTER — Ambulatory Visit: Admit: 2023-05-18 | Discharge: 2023-05-18 | Payer: BC Managed Care – HMO

## 2023-05-18 DIAGNOSIS — M0579 Rheumatoid arthritis with rheumatoid factor of multiple sites without organ or systems involvement: Secondary | ICD-10-CM

## 2023-05-18 DIAGNOSIS — M7061 Trochanteric bursitis, right hip: Secondary | ICD-10-CM

## 2023-05-18 DIAGNOSIS — M12811 Other specific arthropathies, not elsewhere classified, right shoulder: Secondary | ICD-10-CM

## 2023-05-19 ENCOUNTER — Encounter: Admit: 2023-05-19 | Discharge: 2023-05-19 | Payer: BC Managed Care – HMO

## 2023-06-01 ENCOUNTER — Encounter: Admit: 2023-06-01 | Discharge: 2023-06-01 | Payer: BC Managed Care – HMO

## 2023-06-17 ENCOUNTER — Ambulatory Visit: Admit: 2023-06-17 | Discharge: 2023-06-18 | Payer: BC Managed Care – HMO

## 2023-06-17 ENCOUNTER — Encounter: Admit: 2023-06-17 | Discharge: 2023-06-17 | Payer: BC Managed Care – HMO

## 2023-07-06 ENCOUNTER — Encounter: Admit: 2023-07-06 | Discharge: 2023-07-06 | Payer: BC Managed Care – HMO

## 2023-07-06 ENCOUNTER — Ambulatory Visit: Admit: 2023-07-06 | Discharge: 2023-07-07 | Payer: BC Managed Care – HMO

## 2023-07-06 DIAGNOSIS — M7061 Trochanteric bursitis, right hip: Secondary | ICD-10-CM

## 2023-07-06 DIAGNOSIS — M12811 Other specific arthropathies, not elsewhere classified, right shoulder: Secondary | ICD-10-CM

## 2023-07-06 DIAGNOSIS — M0579 Rheumatoid arthritis with rheumatoid factor of multiple sites without organ or systems involvement: Secondary | ICD-10-CM

## 2023-07-06 MED ORDER — CYCLOBENZAPRINE 10 MG PO TAB
10 mg | ORAL_TABLET | Freq: Every evening | ORAL | 0 refills | 30.00000 days | Status: AC | PRN
Start: 2023-07-06 — End: ?

## 2023-07-06 NOTE — Progress Notes
Date of Service: 07/06/2023    Referring Provider:   Lane Hacker, MD      Subjective:             Roberta Manning is a 62 y.o. female.    History of Present Illness  Roberta Manning is referred for further evaluation of arthralgia.  The history is obtained from the patient and review of the electronic medical record.  She has a history of migraine vitamin D and B12 deficiency, hypertension, dyslipidemia, hypothyroidism, gluten intolerence, no smoking, alcohol or drug        Interval history:   Shoulders are better, with better ROM. Pain is still there in the right arm.   Pain in the right mid back, wakes her up in the middle of the night.  Now if having weird dreams. Doesn't feel like HCQ helped with the pain in the hands.  Vertigo, recent.   PIPs bother her the most.  Knees still bother her.         Recall:  Chronic low back pain for years, with sciatica, when she stands up. Morning stiffness for more than an hour. Wakes her up at night. Worse towards end of the day.  Right shoulder painful, with activity. Has been there for 6 months.   Elbows both tender, 3 months ago, mostly when leans on it. No swelling.  Hands achy for 1 year, getting worse. Hard to make a fist. All MCP/PIP/DIP, not wrist. Pain at rest. Better with activity. Morning stiffness for hours. Ring is getting stuck.  Knee pain for a year, worsening in 3 months, when tries to stand up or walk. No pain at rest.   Feet have been bothersome for years, plantar fasciitis, CSI made it worse. Chiropracter helped. Had peripheral neuropathy per EMG.     No hx of ocular inflammation  Has Dry eye--> better  Has Dry mouth--> better   Dry skin  Dry groin  No nasal or mouth ulcers  No hearing loss  No raynauds  No skin rashes  No SOB  No CP  Has sinus issues  Cough with yellow sputum  No GI sxs  No GU sxs  No fever or weight loss    Not UTD on mammogram or coloscopy     Not UTD vaccination       Family hx RA, gout    Laboratory Summary:  RF 69  Ccp >250  Esr neg  Alb 3.8  Lfts wnl  Ana neg  CBC and CMP WNL    Radiology Summary:  X-ray showed chondrocalcinosis of the menisci and small knee effusion     COMPARISON: 04/28/2023, HAND MIN 3 VIEWS BILATERAL.     TECHNIQUE: Multiple grayscale images of the area of interest of the   bilateral hand and wrist were obtained utilizing synovitis protocol.   Additional Doppler acquisitions.     IMPRESSION   IMPRESSION:     Right:   1. Mild synovial thickening with multiple associated punctate bright   reflectors most pronounced at the dorsal radiocarpal joint and at the TFCC   likely in part secondary to underlying chondrocalcinosis. No significant   hyperemia suggesting more chronic synovitis. Recent steroids could also   contribute to lack of hyperemia.     2.  Similar minimal synovial thickening and complex joint fluid and   throughout the right MCP and PIP joints.     3.  Complex fluid within the fourth extensor compartment at the  level of   the wrist likely reflecting chronic tenosynovitis.     4.  No erosive changes. Scattered osseous productive changes.     Left:     1.  Mild synovial thickening with associated punctate bright reflectors   most pronounced at the dorsal radiocarpal joint and TFCC. Mild associated   hyperemia. Findings are compatible with synovitis. No erosive changes   identified.     2. Similar mild to moderate synovitis throughout the left MCP and PIP   joints most pronounced at the third PIP joint with minimal associated   hyperemia.     3. Minimal complex fluid of the fourth extensor compartment at the level   of the wrist likely reflecting minimal chronic tenosynovitis.     4.  No erosive changes. Scattered osseous productive changes.                 Finalized by Claudina Lick, M.D. on 05/18/2023 11:52 AM. Dictated by Claudina Lick, M.D. on 05/18/2023 11:46 AM.       COMPARISON: None.     IMPRESSION       1. 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     2. No acute osseous abnormality.            Finalized by Kimber Relic, M.D. on 06/17/2023 2:08 PM. Dictated by Kimber Relic, M.D. on 06/17/2023 2:05 PM.     Francia Greaves: L SPINE AP & LAT    CLINICAL INDICATION: 62 years rule out DJD.    COMPARISON: None.    IMPRESSION      1.  Mild convex left lumbar curve.    2.  Moderate lower lumbar facet arthritis with mild multilevel  degenerative disc disease.       Treatment History:  NA    Contraception:      Infection Monitoring:  TB Screening  No results found for: TSPOTTB, QUANTIFERTB    Hepatitis B Screening  HBsAg   Date/Time Value Ref Range Status   04/28/2023 11:34 AM NONREACTIVE NR-NONREACTIVE Final     Comment:     HBs antigen not detected.     No results found for: HEPBCTOTAL  Anti HBs   Date/Time Value Ref Range Status   04/28/2023 11:34 AM NEG NEG-NEG Final     Comment:     Individual is considered to be non-immune to HBV infection.       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    Breast pain 11/02/2008    GERD (gastroesophageal reflux disease) 02/08/2009    High cholesterol     Hypertension 2000    Hypertension, poor control 02/08/2009    Migraine headache 02/08/2009    Peripheral neuropathy 2008    Seasonal allergic reaction      Surgical History:   Procedure Laterality Date    TAH AND BSO  1997    endometriosis    CHOLECYSTECTOMY  2000     Family History   Problem Relation Name Age of Onset    Cancer Mother Everlean Patterson         Skin Cancer    Hearing Loss Mother Everlean Patterson     Irritable Bowel Disease Mother Everlean Patterson     Vision Loss Mother Everlean Patterson     Unknown to Patient Father [name] Other maternal great grandmoth     Cancer Maternal  Grandmother Wilma Tull         Skin Cancer    Arthritis Maternal Grandmother Wilma Tull     Diabetes Maternal Grandmother Wilma Tull     Stroke Maternal Grandmother Wilma Tull     Arthritis-rheumatoid Paternal Grandmother Margaret         Crippling     Social History Socioeconomic History    Marital status: Married   Tobacco Use    Smoking status: Never    Smokeless tobacco: Never   Substance and Sexual Activity    Alcohol use: Not Currently     Comment: occasional     Drug use: No    Sexual activity: Yes     Partners: Male     Birth control/protection: Other           Objective:          carvediloL (COREG) 12.5 mg tablet TAKE 1 TABLET BY MOUTH TWICE DAILY WITH MEALS    CHOLECALCIFEROL (VITAMIN D3) (VITAMIN D3 PO) Take  by mouth.    cyanocobalamin (vitamin B-12) (VITAMIN B-12 PO) Take  by mouth daily.    ezetimibe (ZETIA) 10 mg tablet Take 1 tablet by mouth once daily    hydroCHLOROthiazide (HYDRODIURIL) 25 mg tablet Take 1 tablet by mouth once daily    hydroxychloroquine (PLAQUENIL) 200 mg tablet Take two tablets by mouth daily. Take with food.    ibuprofen (ADVIL) 200 mg tablet Take one tablet by mouth as Needed for Pain. Take with food.    Magnesium 200 mg tab Take two tablets by mouth daily.    meloxicam (MOBIC) 7.5 mg tablet Take one tablet by mouth daily.    metFORMIN (GLUCOPHAGE) 500 mg tablet Take one tablet by mouth daily with breakfast.    predniSONE (DELTASONE) 20 mg tablet Take one tablet by mouth daily.    riboflavin(+) (VITAMIN B-2) 100 mg tab Take four tablets by mouth daily.    rosuvastatin (CRESTOR) 20 mg tablet Take one-half tablet by mouth daily.    sertraline (ZOLOFT) 50 mg tablet Take one tablet by mouth daily.    trazodone (DESYREL) 50 mg tablet Take one-half tablet by mouth at bedtime daily.     There were no vitals filed for this visit.  There is no height or weight on file to calculate BMI.     Physical Exam  General: Alert and oriented, no acute distress.  Eye: Clear conjunctiva and lids.  HEENT: Moist mucous membranes with no oral or nasal ulcers.  Lymph: No cervical or supraclavicular lymphadenopathy.  CV: Regular rate and rhythm; no murmur/gallop/rub.  Vessels: Normal radial pulses.  Pulm: Clear to auscultation bilaterally.  Abdomen: Soft, nontender, nondistended.    Skin: No malar rash, heliotrope rash, Gottron's papules, sclerodactyly, telangiectasias, or psoriatic plaques.  MSK: No synovitis appreciated of the sternoclavicular joints, acromioclavicular joints, shoulders, elbows, knees, and ankles.  Right shoulder limited ROM abduction and FF  Elbows WNL  Wirst WNL  Hands warm  Right 2nd PIP tender  Left mcp 2nd, 3rd tender  Left 5th pip tender  Right sided trochanteric bursitis  Knees mild effusion but not inflamted  Plantar fascitis in both feet       Assessment and Plan:  Latona Parnes is referred for further evaluation of arthralgia. She was found to have strongly positive CCP and RF. Knee xray with CPPD and small effusion.  On exam, bilateral hands with probable synovitis mostly in the MCPs and PIPs. Her shoulder and knee pain  is likely mechanical in nature.  Discussed getting hand xray to try and differentiate between Rheumatoid arthritis and Pseudogout.   Discussed one week of prednisone both for symptomatic relief and as a diagnostic tool to determine if she does have inflammation in the joints given the subtle findings on exam.   Once test/xray results are back, will decide on the most appropriate therapy.     UPDATE:  Patient did not feel any difference with steroids, hence an MSK ultrasound of the hand was performed, and was concerning for active synovitis, and chondrocalcinosis.  At the time discussed with the patient adding hydroxychloroquine, and patient has been on it for almost a month and a half, without significant improvement in her hand symptoms.  She also reports having some nightmares after initiating the medication.  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  Discussed with the patient adding a muscle relaxant for her back pain, additionally patient will be seeing orthopedics this week for some of her mechanical symptoms.  Discussed that if her nightmares persist, we will likely hold the Plaquenil and reevaluate, however if her nightmares resolve we will give hydroxychloroquine an additional month or 2 to see whether or not there is any benefit.      Recommendations:  Voltaren gel for right trochanteric bursitis   PT for shoulder RTC, knee pain and plantar fasciitis   Patient will be seeing orthopedics this week  Flexeril 10 mg at bedtime  Continue hydroxychloroquine for now with plan to either decrease the dose or discontinue if nightmares persist   Needs UTD mammogram and colonoscopy     RCT in 3 months     Duaine Dredge Doumeth,MD  Assistant Professor   [facility]  Division of Rheumatology  [address]  [place], [place] [address]

## 2023-07-10 ENCOUNTER — Encounter: Admit: 2023-07-10 | Discharge: 2023-07-10 | Payer: BC Managed Care – HMO

## 2023-07-17 ENCOUNTER — Encounter: Admit: 2023-07-17 | Discharge: 2023-07-17 | Payer: BC Managed Care – HMO

## 2023-07-17 MED ORDER — CARVEDILOL 12.5 MG PO TAB
12.5 mg | ORAL_TABLET | Freq: Two times a day (BID) | ORAL | 1 refills | 90.00000 days | Status: AC
Start: 2023-07-17 — End: ?

## 2023-08-11 ENCOUNTER — Encounter: Admit: 2023-08-11 | Discharge: 2023-08-11 | Payer: BC Managed Care – HMO

## 2023-08-13 ENCOUNTER — Encounter: Admit: 2023-08-13 | Discharge: 2023-08-13 | Payer: BC Managed Care – HMO

## 2023-08-13 ENCOUNTER — Ambulatory Visit: Admit: 2023-08-13 | Discharge: 2023-08-14 | Payer: BC Managed Care – HMO

## 2023-08-20 ENCOUNTER — Encounter: Admit: 2023-08-20 | Discharge: 2023-08-20 | Payer: BC Managed Care – HMO

## 2023-08-21 ENCOUNTER — Encounter: Admit: 2023-08-21 | Discharge: 2023-08-21 | Payer: BC Managed Care – HMO

## 2023-08-21 DIAGNOSIS — I10 Essential (primary) hypertension: Secondary | ICD-10-CM

## 2023-08-21 DIAGNOSIS — Z79899 Other long term (current) drug therapy: Secondary | ICD-10-CM

## 2023-08-21 DIAGNOSIS — E782 Mixed hyperlipidemia: Secondary | ICD-10-CM

## 2023-08-27 ENCOUNTER — Encounter: Admit: 2023-08-27 | Discharge: 2023-08-27 | Payer: BC Managed Care – HMO

## 2023-09-10 ENCOUNTER — Encounter: Admit: 2023-09-10 | Discharge: 2023-09-10 | Payer: BC Managed Care – HMO

## 2023-09-10 DIAGNOSIS — S73191A Other sprain of right hip, initial encounter: Secondary | ICD-10-CM

## 2023-09-10 DIAGNOSIS — M25551 Pain in right hip: Secondary | ICD-10-CM

## 2023-09-18 ENCOUNTER — Encounter: Admit: 2023-09-18 | Discharge: 2023-09-18 | Payer: BC Managed Care – HMO

## 2023-09-18 MED ORDER — ROSUVASTATIN 20 MG PO TAB
20 mg | ORAL_TABLET | Freq: Every day | ORAL | 3 refills | 90.00000 days | Status: AC
Start: 2023-09-18 — End: ?

## 2023-09-18 NOTE — Telephone Encounter
 Reviewed labs with Dr. Paris Lore:     LDL has increased from 67 to 87 within the past year due. Last year we decreased Crestor from 20 to 10 mg due to patient reporting issues with itching.    Lets see if patient is agreeable with resuming Crestor back at 20 mg. If patient is hesitant or prefers alternative, we can also try Lipitor 40 mg.  BMP remains stable on hydrochlorothiazide.       09/18/2023 1:39 PM   Reviewed results with patient - patient agreeable with restarting Crestor back at 20 mg. Prescription sent to patient's preferred pharmacy.

## 2023-10-05 ENCOUNTER — Encounter: Admit: 2023-10-05 | Discharge: 2023-10-05 | Payer: BC Managed Care – HMO

## 2023-10-05 ENCOUNTER — Ambulatory Visit: Admit: 2023-10-05 | Discharge: 2023-10-06 | Payer: BC Managed Care – HMO

## 2023-10-05 MED ORDER — FOLIC ACID 1 MG PO TAB
1 mg | ORAL_TABLET | Freq: Every day | ORAL | 0 refills | Status: AC
Start: 2023-10-05 — End: ?

## 2023-10-05 MED ORDER — METHOTREXATE SODIUM 2.5 MG PO TAB
10 mg | ORAL_TABLET | ORAL | 0 refills | Status: AC
Start: 2023-10-05 — End: ?

## 2023-10-05 NOTE — Patient Instructions
-  Stop plaquenil  -Start methotrexate 4 tablets once a week, then increase to 6 tablets once a week   -Start folic acid once a day  -Blood work in 4 weeks  -Can consider turmeric, omega-3, weight loss  -Can consider right shoulder MRI  -Can consider EMG for the nerve problem

## 2023-10-05 NOTE — Progress Notes
 Date of Service: 10/05/2023    Referring Provider:   Lane Hacker, MD      Subjective:             Roberta Manning is a 63 y.o. female.    History of Present Illness  Roberta Manning is referred for further evaluation of arthralgia.  The history is obtained from the patient and review of the electronic medical record.  She has a history of migraine vitamin D and B12 deficiency, hypertension, dyslipidemia, hypothyroidism, gluten intolerence, no smoking, alcohol or drug        Interval history:   Itching over the back. Feels hands are worse.   Hands hurt all the time. Feel swollen. Started aquatherapy.   Flexeril helped initially.  Morning stiffness for 10 -15 mins in the feet and hip.           Recall:  Chronic low back pain for years, with sciatica, when she stands up. Morning stiffness for more than an hour. Wakes her up at night. Worse towards end of the day.  Right shoulder painful, with activity. Has been there for 6 months.   Elbows both tender, 3 months ago, mostly when leans on it. No swelling.  Hands achy for 1 year, getting worse. Hard to make a fist. All MCP/PIP/DIP, not wrist. Pain at rest. Better with activity. Morning stiffness for hours. Ring is getting stuck.  Knee pain for a year, worsening in 3 months, when tries to stand up or walk. No pain at rest.   Feet have been bothersome for years, plantar fasciitis, CSI made it worse. Chiropracter helped. Had peripheral neuropathy per EMG.     No hx of ocular inflammation  Has Dry eye--> better  Has Dry mouth--> better   Dry skin  Dry groin  No nasal or mouth ulcers  No hearing loss  No raynauds  No skin rashes  No SOB  No CP  Has sinus issues  Cough with yellow sputum  No GI sxs  No GU sxs  No fever or weight loss    Not UTD on mammogram or coloscopy     Not UTD vaccination       Family hx RA, gout    Laboratory Summary:  RF 69  Ccp >250  Esr neg  Alb 3.8  Lfts wnl  Ana neg  CBC and CMP WNL    Radiology Summary:  X-ray showed chondrocalcinosis of the menisci and small knee effusion     COMPARISON: 04/28/2023, HAND MIN 3 VIEWS BILATERAL.     TECHNIQUE: Multiple grayscale images of the area of interest of the   bilateral hand and wrist were obtained utilizing synovitis protocol.   Additional Doppler acquisitions.     IMPRESSION   IMPRESSION:     Right:   1. Mild synovial thickening with multiple associated punctate bright   reflectors most pronounced at the dorsal radiocarpal joint and at the TFCC   likely in part secondary to underlying chondrocalcinosis. No significant   hyperemia suggesting more chronic synovitis. Recent steroids could also   contribute to lack of hyperemia.     2.  Similar minimal synovial thickening and complex joint fluid and   throughout the right MCP and PIP joints.     3.  Complex fluid within the fourth extensor compartment at the level of   the wrist likely reflecting chronic tenosynovitis.     4.  No erosive changes. Scattered osseous productive changes.  Left:     1.  Mild synovial thickening with associated punctate bright reflectors   most pronounced at the dorsal radiocarpal joint and TFCC. Mild associated   hyperemia. Findings are compatible with synovitis. No erosive changes   identified.     2. Similar mild to moderate synovitis throughout the left MCP and PIP   joints most pronounced at the third PIP joint with minimal associated   hyperemia.     3. Minimal complex fluid of the fourth extensor compartment at the level   of the wrist likely reflecting minimal chronic tenosynovitis.     4.  No erosive changes. Scattered osseous productive changes.                 Finalized by Claudina Lick, M.D. on 05/18/2023 11:52 AM. Dictated by Claudina Lick, M.D. on 05/18/2023 11:46 AM.       COMPARISON: None.     IMPRESSION       1. 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     2. No acute osseous abnormality.            Finalized by Kimber Relic, M.D. on 06/17/2023 2:08 PM. Dictated by Kimber Relic, M.D. on 06/17/2023 2:05 PM.     Francia Greaves: L SPINE AP & LAT    CLINICAL INDICATION: 62 years rule out DJD.    COMPARISON: None.    IMPRESSION      1.  Mild convex left lumbar curve.    2.  Moderate lower lumbar facet arthritis with mild multilevel  degenerative disc disease.   MRI hip:    IMPRESSION       1. Mild hip arthrosis with small nondisplaced superior-lateral labral   tear.     2. Mild gluteus minimus tendinosis.     Treatment History:  NA    Contraception:      Infection Monitoring:  TB Screening  No results found for: TSPOTTB, QUANTIFERTB    Hepatitis B Screening  HBsAg   Date/Time Value Ref Range Status   04/28/2023 11:34 AM NONREACTIVE NR-NONREACTIVE Final     Comment:     HBs antigen not detected.     No results found for: HEPBCTOTAL  Anti HBs   Date/Time Value Ref Range Status   04/28/2023 11:34 AM NEG NEG-NEG Final     Comment:     Individual is considered to be non-immune to HBV infection.       Past Medical History:    Arthritis, rheumatoid (HCC)    Back pain    Breast pain    GERD (gastroesophageal reflux disease)    High cholesterol    Hypertension    Hypertension, poor control    Migraine headache    Peripheral neuropathy    Seasonal allergic reaction     Surgical History:   Procedure Laterality Date    TAH AND BSO  1997    endometriosis    CHOLECYSTECTOMY  2000     Family History   Problem Relation Name Age of Onset    Cancer Mother Roberta Manning         Skin Cancer    Hearing Loss Mother Roberta Manning     Irritable Bowel Disease Mother Roberta Manning     Vision Loss Mother Roberta Manning     Unknown to Patient Father Roberta Manning Other maternal great grandmoth     Cancer Maternal Grandmother Roberta  Manning         Skin Cancer    Arthritis Maternal Grandmother Roberta Manning     Diabetes Maternal Grandmother Roberta Manning     Stroke Maternal Grandmother Roberta Manning     Arthritis-rheumatoid Paternal Grandmother Roberta Manning         Crippling     Social History     Socioeconomic History Marital status: Married   Tobacco Use    Smoking status: Never    Smokeless tobacco: Never   Substance and Sexual Activity    Alcohol use: Not Currently     Comment: occasional     Drug use: Never    Sexual activity: Yes     Partners: Male     Birth control/protection: Other           Objective:          carvediloL (COREG) 12.5 mg tablet TAKE 1 TABLET BY MOUTH TWICE DAILY WITH MEALS    CHOLECALCIFEROL (VITAMIN D3) (VITAMIN D3 PO) Take  by mouth.    cyanocobalamin (vitamin B-12) (VITAMIN B-12 PO) Take  by mouth daily.    cyclobenzaprine (FLEXERIL) 10 mg tablet Take one tablet by mouth at bedtime as needed for Muscle Cramps.    ezetimibe (ZETIA) 10 mg tablet Take 1 tablet by mouth once daily    hydroCHLOROthiazide (HYDRODIURIL) 25 mg tablet Take 1 tablet by mouth once daily    hydroxychloroquine (PLAQUENIL) 200 mg tablet Take two tablets by mouth daily. Take with food.    ibuprofen (ADVIL) 200 mg tablet Take one tablet by mouth as Needed for Pain. Take with food.    Magnesium 200 mg tab Take two tablets by mouth daily.    meloxicam (MOBIC) 7.5 mg tablet Take one tablet by mouth daily.    metFORMIN (GLUCOPHAGE) 500 mg tablet Take one tablet by mouth daily with breakfast.    predniSONE (DELTASONE) 20 mg tablet Take one tablet by mouth daily.    riboflavin(+) (VITAMIN B-2) 100 mg tab Take four tablets by mouth daily.    rosuvastatin (CRESTOR) 20 mg tablet Take one tablet by mouth daily.    sertraline (ZOLOFT) 50 mg tablet Take one tablet by mouth daily.    trazodone (DESYREL) 50 mg tablet Take one-half tablet by mouth at bedtime daily.     There were no vitals filed for this visit.  There is no height or weight on file to calculate BMI.     Physical Exam  General: Alert and oriented, no acute distress.  Eye: Clear conjunctiva and lids.  HEENT: Moist mucous membranes with no oral or nasal ulcers.  Lymph: No cervical or supraclavicular lymphadenopathy.  CV: Regular rate and rhythm; no murmur/gallop/rub.  Vessels: Normal radial pulses.  Pulm: Clear to auscultation bilaterally.  Abdomen: Soft, nontender, nondistended.    Skin: No malar rash, heliotrope rash, Gottron's papules, sclerodactyly, telangiectasias, or psoriatic plaques.  MSK: No synovitis appreciated of the sternoclavicular joints, acromioclavicular joints, shoulders, elbows, knees, and ankles.  Right shoulder limited ROM abduction and FF  Elbows WNL  Wirst WNL  Hands warm  Right 2nd PIP tender  Left mcp 2nd, 3rd tender  Left 5th pip tender  Right sided trochanteric bursitis  Knees mild effusion but not inflamted  Plantar fascitis in both feet       Assessment and Plan:  Jaiah Weigel is referred for further evaluation of arthralgia. She was found to have strongly positive CCP and RF. Knee xray with CPPD and small effusion.  On exam, bilateral hands with probable synovitis mostly in the MCPs and PIPs. Her shoulder and knee pain is likely mechanical in nature.  Discussed getting hand xray to try and differentiate between Rheumatoid arthritis and Pseudogout.   Discussed one week of prednisone both for symptomatic relief and as a diagnostic tool to determine if she does have inflammation in the joints given the subtle findings on exam.   Once test/xray results are back, will decide on the most appropriate therapy.     UPDATE:  Patient did not feel any difference with steroids, hence an MSK ultrasound of the hand was performed, and was concerning for active synovitis, and chondrocalcinosis.  At the time discussed with the patient adding hydroxychloroquine, and patient has been on it for almost a month and a half, without significant improvement in her hand symptoms.  She also reports having some nightmares after initiating the medication.  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  Discussed with the patient adding a muscle relaxant for her back pain, additionally patient will be seeing orthopedics this week for some of her mechanical symptoms.  Discussed that if her nightmares persist, we will likely hold the Plaquenil and reevaluate, however if her nightmares resolve we will give hydroxychloroquine an additional month or 2 to see whether or not there is any benefit.      Update 3/3  Plaquenil not helping and feels hands are worse and swollen. Right hip MRI with labrum tear, considering surgery. Started aqua therapy.   Right shoulder is worse.  Flexiril helped with the back spasms   Discussed d/c HCQ and starting methotrexate. Counseled on side effects and patient is agreeable to try it.     Recommendations:  Methotrexate 4 tabs weekly then increase to 6 tabs weekly after 2 weeks  Folic acid 1 tab daily  Need to get UTD on vaccination  Blood work in 4 weeks   PT for shoulder RTC, knee pain and plantar fasciitis   Patient will be seeing orthopedics this week  Flexeril 10 mg at bedtime as needed for back spasms   D/c HCQ  Consider right shoulder MRI  Consider EMG evaluation for hand throbbing   Needs UTD mammogram and colonoscopy     RCT in 3 months     Duaine Dredge Doumeth,MD  Assistant Professor   [facility]  Division of Rheumatology  [address]  [place], [place] [address]       Total Time Today was 40 minutes in the following activities: Preparing to see the patient, Obtaining and/or reviewing separately obtained history, Performing a medically appropriate examination and/or evaluation, Counseling and educating the patient/family/caregiver, Ordering medications, tests, or procedures, Referring and communication with other health care professionals (when not separately reported), Documenting clinical information in the electronic or other health record, and Independently interpreting results (not separately reported) and communicating results to the patient/family/caregiver

## 2023-10-06 DIAGNOSIS — M0579 Rheumatoid arthritis with rheumatoid factor of multiple sites without organ or systems involvement: Secondary | ICD-10-CM

## 2023-10-07 ENCOUNTER — Ambulatory Visit: Admit: 2023-10-07 | Discharge: 2023-10-08 | Payer: BC Managed Care – HMO

## 2023-10-07 ENCOUNTER — Encounter: Admit: 2023-10-07 | Discharge: 2023-10-07 | Payer: BC Managed Care – HMO

## 2023-10-07 ENCOUNTER — Ambulatory Visit: Admit: 2023-10-07 | Discharge: 2023-10-07 | Payer: BC Managed Care – HMO

## 2023-10-09 ENCOUNTER — Encounter: Admit: 2023-10-09 | Discharge: 2023-10-09 | Payer: BC Managed Care – HMO

## 2023-10-19 ENCOUNTER — Encounter: Admit: 2023-10-19 | Discharge: 2023-10-19 | Payer: BC Managed Care – HMO

## 2023-11-03 ENCOUNTER — Encounter: Admit: 2023-11-03 | Discharge: 2023-11-03 | Payer: BC Managed Care – HMO

## 2023-11-09 ENCOUNTER — Encounter: Admit: 2023-11-09 | Discharge: 2023-11-09 | Payer: BC Managed Care – HMO

## 2023-11-09 ENCOUNTER — Ambulatory Visit: Admit: 2023-11-09 | Discharge: 2023-11-09 | Payer: BC Managed Care – HMO

## 2023-11-12 ENCOUNTER — Encounter: Admit: 2023-11-12 | Discharge: 2023-11-12 | Payer: BC Managed Care – HMO

## 2023-11-12 MED ORDER — CYCLOBENZAPRINE 10 MG PO TAB
10 mg | ORAL_TABLET | Freq: Every evening | ORAL | 0 refills | 30.00000 days | Status: AC | PRN
Start: 2023-11-12 — End: ?

## 2023-11-12 NOTE — Telephone Encounter
 Received request for refill of cyclobenzaprine. Patient last seen 10/05/23. Patient scheduled for follow-up 01/04/24. Routing to provider for approval.    LOV  Recommendations:  Methotrexate 4 tabs weekly then increase to 6 tabs weekly after 2 weeks  Folic acid 1 tab daily  Need to get UTD on vaccination  Blood work in 4 weeks   PT for shoulder RTC, knee pain and plantar fasciitis   Patient will be seeing orthopedics this week  Flexeril 10 mg at bedtime as needed for back spasms   D/c HCQ  Consider right shoulder MRI  Consider EMG evaluation for hand throbbing   Needs UTD mammogram and colonoscopy      RCT in 3 months      Roberta Hesselbach Doumeth,MD  Assistant Professor   [facility]  Division of Rheumatology  [address]  [place]  [place], [place] [address]

## 2023-11-16 ENCOUNTER — Encounter: Admit: 2023-11-16 | Discharge: 2023-11-16 | Payer: BC Managed Care – HMO

## 2023-11-16 ENCOUNTER — Ambulatory Visit: Admit: 2023-11-16 | Discharge: 2023-11-17 | Payer: BC Managed Care – HMO

## 2023-11-23 ENCOUNTER — Encounter: Admit: 2023-11-23 | Discharge: 2023-11-23 | Payer: BC Managed Care – HMO

## 2023-11-23 ENCOUNTER — Ambulatory Visit: Admit: 2023-11-23 | Discharge: 2023-11-24 | Payer: BC Managed Care – HMO

## 2023-11-27 ENCOUNTER — Encounter: Admit: 2023-11-27 | Discharge: 2023-11-27 | Payer: BC Managed Care – HMO

## 2023-11-27 ENCOUNTER — Ambulatory Visit: Admit: 2023-11-27 | Discharge: 2023-11-28 | Payer: BC Managed Care – HMO

## 2023-11-27 DIAGNOSIS — I10 Essential (primary) hypertension: Secondary | ICD-10-CM

## 2023-11-27 DIAGNOSIS — E782 Mixed hyperlipidemia: Secondary | ICD-10-CM

## 2023-11-27 DIAGNOSIS — R0989 Other specified symptoms and signs involving the circulatory and respiratory systems: Secondary | ICD-10-CM

## 2023-11-27 NOTE — Progress Notes
 Date of Service: 11/27/2023    Roberta Manning is a 63 y.o. female.       HPI     Roberta Manning is a 63 year old female who I am seeing for cardiovascular risk factor optimization.  She has had difficulties in the past with blood pressure control however today in the office her blood pressure is well-controlled.  She is done well for the last couple years and is really had fairly stable blood pressure readings.  She denies any new exertional symptoms of chest discomfort or shortness of breath and otherwise from a cardiovascular standpoint really has no other new subjective issues.  We did discuss her current lipid regimen which includes a combination of rosuvastatin and Zetia.  I would like to go ahead and obtain a coronary calcium score to better risk assess her and determine how aggressive we really need to be with lipid management.         Vitals:    11/27/23 0915   BP: 120/80   BP Source: Arm, Left Upper   Pulse: 74   PainSc: Zero   Weight: 66.2 kg (146 lb)   Height: 160 cm (5' 3)     Body mass index is 25.86 kg/m?Roberta Manning     Past Medical History  Patient Active Problem List    Diagnosis Date Noted    Acetabular labrum tear, right, initial encounter 08/24/2023    Right hip pain 07/12/2023    Mixed hyperlipidemia 09/18/2016    Bilateral Breast pain-surgical 11/03/2010     DIAGNOSIS:  Breast pain   HISTORY:  Roberta Manning is a 63 yo female who presented to the [facility] on  01/14/2011 for further evaluation of her breast pain. Bilateral diagnostic mammogram on 12/17/10 [facility] showed no suspicious findings; small benign appearing nodes were noted in both axillae.  PERTINENT PMH:  HTN, migraines, GERD  OB/GYN HISTORY:    FAMILY HISTORY:    PHYSICAL EXAM on PRESENTATION:    REFERRED BY:  Roberta Manning        Primary hypertension 02/08/2009     a. CT abdomen and pelvis with and without contrast: no evidence of renal artery stenosis;    normal kidney size.      Migraine headache 02/08/2009     a.  [facility] multiple injections in cervical spine.          b. Seen by Neurology and is presently undergoing physical therapy on her cervical spine.       GERD (gastroesophageal reflux disease) 02/08/2009         Review of Systems   Constitutional: Negative.   HENT: Negative.     Eyes: Negative.    Cardiovascular: Negative.    Respiratory: Negative.     Endocrine: Negative.    Hematologic/Lymphatic: Negative.    Skin: Negative.    Musculoskeletal: Negative.    Gastrointestinal: Negative.    Genitourinary: Negative.    Neurological: Negative.    Psychiatric/Behavioral: Negative.     Allergic/Immunologic: Negative.        Physical Exam  Physical Exam   General Appearance: alert and oriented, no acute distress  Skin: warm, moist, no ulcers  Head: normocephalic, symmetric  Eyes: EOMI, PERRL, sclera are clear and without icterus  ENT: unremarkable, nares patent  Neck Veins: neck veins are flat, neck veins are not distended  Carotid Arteries: normal carotid upstroke bilaterally, no bruits  Chest Inspection: chest is  normal in appearance  Auscultation/Percussion: lungs clear to auscultation, no rales, rhonchi, wheezes or friction rub appreciated  Cardiac Rhythm: regular rhythm and normal rate  Cardiac Auscultation: Normal S1 & S2, no S3 or S4, no rub - normal pmi  Murmurs: no cardiac murmurs   Extremities: no lower extremity edema bilaterally; 2+ symmetric distal pulses  Muskuloskeletal: no obvious deformity  Neurologic Exam: neurological assessment grossly intact  Mood and Affect: Appropriate    Cardiovascular Health Factors  Vitals BP Readings from Last 3 Encounters:   11/27/23 120/80   11/23/23 (P) 120/82   11/16/23 (!) (P) 134/95     Wt Readings from Last 3 Encounters:   11/27/23 66.2 kg (146 lb)   11/23/23 72.6 kg (160 lb)   11/16/23 72.6 kg (160 lb)     BMI Readings from Last 3 Encounters:   11/27/23 25.86 kg/m?   11/23/23 28.34 kg/m?   11/16/23 28.34 kg/m? Smoking Social History     Tobacco Use   Smoking Status Never   Smokeless Tobacco Never      Lipid Profile Cholesterol   Date Value Ref Range Status   09/17/2023 162  Final     HDL   Date Value Ref Range Status   09/17/2023 56  Final     LDL   Date Value Ref Range Status   09/17/2023 86  Final     Triglycerides   Date Value Ref Range Status   09/17/2023 102  Final      Blood Sugar Hemoglobin A1C   Date Value Ref Range Status   07/18/2015 5.5  Final     Glucose   Date Value Ref Range Status   09/17/2023 98  Final   03/26/2022 101  Final   09/19/2020 95  Final          Problems Addressed Today  Encounter Diagnoses   Name Primary?    Mixed hyperlipidemia Yes    Cardiovascular symptoms     Primary hypertension        Assessment and Plan     Hypertension-she is currently on carvedilol and has been on hydrochlorothiazide.  Her blood pressures been well-controlled so I would like to trial her off of hydrochlorothiazide.  She has lost quite a bit of weight over the last several months and I would predict this is improved her blood pressure control as well.  Will plan to have her remain on carvedilol she is currently doing.  There may be a benefit to initiating an angiotensin receptor blocker or ACE inhibitor given her concurrent blood sugars however she has had some intolerance to ACE inhibitors in the past.  Dyslipidemia-she is currently on a combination of rosuvastatin and Zetia.  I would like to obtain a coronary calcium score to better give us  a gauge as to how aggressive we should be with controlling risk factors.      Will plan to see her back for routine follow-up and I will look for the results of her upcoming coronary calcium score.         Current Medications (including today's revisions)   carvediloL (COREG) 12.5 mg tablet TAKE 1 TABLET BY MOUTH TWICE DAILY WITH MEALS    CHOLECALCIFEROL (VITAMIN D3) (VITAMIN D3 PO) Take  by mouth.    cyanocobalamin (vitamin B-12) (VITAMIN B-12 PO) Take  by mouth daily. cyclobenzaprine (FLEXERIL) 10 mg tablet Take one tablet by mouth at bedtime as needed for Muscle Cramps.    estradioL (ESTRACE) 1 mg tablet  ezetimibe (ZETIA) 10 mg tablet Take 1 tablet by mouth once daily    folic acid (FOLVITE) 1 mg tablet Take one tablet by mouth daily.    hydroxychloroquine (PLAQUENIL) 200 mg tablet Take two tablets by mouth daily. Take with food.    Magnesium 200 mg tab Take two tablets by mouth daily.    metFORMIN (GLUCOPHAGE) 500 mg tablet Take one tablet by mouth daily with breakfast.    methotrexate sodium 2.5 mg tablet Take four tablets by mouth every 7 days.    predniSONE (DELTASONE) 20 mg tablet Take one tablet by mouth daily.    riboflavin(+) (VITAMIN B-2) 100 mg tab Take four tablets by mouth daily.    rosuvastatin (CRESTOR) 20 mg tablet Take one tablet by mouth daily.    trazodone (DESYREL) 50 mg tablet Take one-half tablet by mouth at bedtime daily.

## 2023-12-09 ENCOUNTER — Encounter: Admit: 2023-12-09 | Discharge: 2023-12-09 | Payer: BC Managed Care – HMO

## 2023-12-09 ENCOUNTER — Ambulatory Visit: Admit: 2023-12-09 | Discharge: 2023-12-10 | Payer: BC Managed Care – HMO

## 2024-01-04 ENCOUNTER — Encounter: Admit: 2024-01-04 | Discharge: 2024-01-04 | Payer: BLUE CROSS/BLUE SHIELD

## 2024-01-17 NOTE — Progress Notes
 Date of Service: 01/18/2024          Subjective:             Roberta Manning is a 63 y.o. female.    History of Present Illness  Roberta Manning is referred for further evaluation of arthralgia.  The history is obtained from the patient and review of the electronic medical record.  She has a history of migraine vitamin D and B12 deficiency, hypertension, dyslipidemia, hypothyroidism, gluten intolerence, no smoking, alcohol or drug        Interval history:   Got PRP to the right hip and right shoulder.  Right hip is much better.   Right shoulder feel much better.  Following with orthopedics with the back.  Did not try voltaren gel to the hands  Doesn't want MTX- worried about side effects  Knees are better   Wrist brace   DIP joints ache   Wrists, Mcp, PIP are better      Recall:  Chronic low back pain for years, with sciatica, when she stands up. Morning stiffness for more than an hour. Wakes her up at night. Worse towards end of the day.  Right shoulder painful, with activity. Has been there for 6 months.   Elbows both tender, 3 months ago, mostly when leans on it. No swelling.  Hands achy for 1 year, getting worse. Hard to make a fist. All MCP/PIP/DIP, not wrist. Pain at rest. Better with activity. Morning stiffness for hours. Ring is getting stuck.  Knee pain for a year, worsening in 3 months, when tries to stand up or walk. No pain at rest.   Feet have been bothersome for years, plantar fasciitis, CSI made it worse. Chiropracter helped. Had peripheral neuropathy per EMG.     No hx of ocular inflammation  Has Dry eye--> better  Has Dry mouth--> better   Dry skin  Dry groin  No nasal or mouth ulcers  No hearing loss  No raynauds  No skin rashes  No SOB  No CP  Has sinus issues  Cough with yellow sputum  No GI sxs  No GU sxs  No fever or weight loss    Not UTD on mammogram or coloscopy     Not UTD vaccination       Family hx RA, gout    Laboratory Summary:  RF 69  Ccp >250  Esr neg  Alb 3.8  Lfts wnl  Ana neg  CBC and CMP WNL    Radiology Summary:  X-ray showed chondrocalcinosis of the menisci and small knee effusion     COMPARISON: 04/28/2023, HAND MIN 3 VIEWS BILATERAL.     TECHNIQUE: Multiple grayscale images of the area of interest of the   bilateral hand and wrist were obtained utilizing synovitis protocol.   Additional Doppler acquisitions.     IMPRESSION   IMPRESSION:     Right:   1. Mild synovial thickening with multiple associated punctate bright   reflectors most pronounced at the dorsal radiocarpal joint and at the TFCC   likely in part secondary to underlying chondrocalcinosis. No significant   hyperemia suggesting more chronic synovitis. Recent steroids could also   contribute to lack of hyperemia.     2.  Similar minimal synovial thickening and complex joint fluid and   throughout the right MCP and PIP joints.     3.  Complex fluid within the fourth extensor compartment at the level of   the wrist likely  reflecting chronic tenosynovitis.     4.  No erosive changes. Scattered osseous productive changes.     Left:     1.  Mild synovial thickening with associated punctate bright reflectors   most pronounced at the dorsal radiocarpal joint and TFCC. Mild associated   hyperemia. Findings are compatible with synovitis. No erosive changes   identified.     2. Similar mild to moderate synovitis throughout the left MCP and PIP   joints most pronounced at the third PIP joint with minimal associated   hyperemia.     3. Minimal complex fluid of the fourth extensor compartment at the level   of the wrist likely reflecting minimal chronic tenosynovitis.     4.  No erosive changes. Scattered osseous productive changes.                 Finalized by Jayson Riles, M.D. on 05/18/2023 11:52 AM. Dictated by Jayson Riles, M.D. on 05/18/2023 11:46 AM.       COMPARISON: None.     IMPRESSION       1. 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     2. No acute osseous abnormality. Finalized by Warren Melbourne, M.D. on 06/17/2023 2:08 PM. Dictated by Warren Melbourne, M.D. on 06/17/2023 2:05 PM.     ERASMO: L SPINE AP & LAT    CLINICAL INDICATION: 62 years rule out DJD.    COMPARISON: None.    IMPRESSION      1.  Mild convex left lumbar curve.    2.  Moderate lower lumbar facet arthritis with mild multilevel  degenerative disc disease.   MRI hip:    IMPRESSION       1. Mild hip arthrosis with small nondisplaced superior-lateral labral   tear.     2. Mild gluteus minimus tendinosis.       IMPRESSION       1. Low-grade, partial-thickness, partial-width articular sided tear of   the distal supraspinatus tendon.   2.  Superior labral degeneration from anterior to posterior.   Treatment History:  NA    Contraception:      Infection Monitoring:  TB Screening  No results found for: TSPOTTB, QUANTIFERTB    Hepatitis B Screening  HBsAg   Date/Time Value Ref Range Status   04/28/2023 11:34 AM NONREACTIVE NR-NONREACTIVE Final     Comment:     HBs antigen not detected.     No results found for: HEPBCTOTAL  Anti HBs   Date/Time Value Ref Range Status   04/28/2023 11:34 AM NEG NEG-NEG Final     Comment:     Individual is considered to be non-immune to HBV infection.       Past Medical History:    Arthritis, rheumatoid (CMS-HCC)    Back pain    Breast pain    GERD (gastroesophageal reflux disease)    Heart disease    High cholesterol    Hypertension    Hypertension, poor control    Migraine headache    Peripheral neuropathy    Seasonal allergic reaction     Surgical History:   Procedure Laterality Date    TAH AND BSO  1997    endometriosis    CHOLECYSTECTOMY  2000     Family History   Problem Relation Name Age of Onset    Cancer Mother Roberta Manning         Skin Cancer  Hearing Loss Mother Roberta Manning     Irritable Bowel Disease Mother Roberta Manning     Vision Loss Mother Roberta Manning     Unknown to Patient Father Roberta Manning Other maternal great grandmoth     Cancer Maternal Grandmother Roberta Manning         Skin Cancer    Arthritis Maternal Grandmother Roberta Manning     Diabetes Maternal Grandmother Roberta Manning     Stroke Maternal Grandmother Roberta Manning     Arthritis-rheumatoid Paternal Grandmother Margaret         Crippling    Arthritis Paternal Grandmother Margaret         Crippling RA     Social History     Socioeconomic History    Marital status: Married   Tobacco Use    Smoking status: Never    Smokeless tobacco: Never   Substance and Sexual Activity    Alcohol use: Not Currently     Comment: occasional     Drug use: Never    Sexual activity: Yes     Partners: Male     Birth control/protection: Other, Post-menopausal           Objective:          carvediloL (COREG) 12.5 mg tablet TAKE 1 TABLET BY MOUTH TWICE DAILY WITH MEALS    CHOLECALCIFEROL (VITAMIN D3) (VITAMIN D3 PO) Take  by mouth.    cyanocobalamin (vitamin B-12) (VITAMIN B-12 PO) Take  by mouth daily.    cyclobenzaprine (FLEXERIL) 10 mg tablet Take one tablet by mouth at bedtime as needed for Muscle Cramps.    estradioL (ESTRACE) 1 mg tablet     ezetimibe (ZETIA) 10 mg tablet Take 1 tablet by mouth once daily    folic acid (FOLVITE) 1 mg tablet Take one tablet by mouth daily.    hydroxychloroquine (PLAQUENIL) 200 mg tablet Take two tablets by mouth daily. Take with food.    Magnesium 200 mg tab Take two tablets by mouth daily.    metFORMIN (GLUCOPHAGE) 500 mg tablet Take one tablet by mouth daily with breakfast.    methotrexate sodium 2.5 mg tablet Take four tablets by mouth every 7 days.    predniSONE (DELTASONE) 20 mg tablet Take one tablet by mouth daily.    riboflavin(+) (VITAMIN B-2) 100 mg tab Take four tablets by mouth daily.    rosuvastatin (CRESTOR) 20 mg tablet Take one tablet by mouth daily.    trazodone (DESYREL) 50 mg tablet Take one-half tablet by mouth at bedtime daily.     There were no vitals filed for this visit.  There is no height or weight on file to calculate BMI.     Physical Exam  General: Alert and oriented, no acute distress.  Eye: Clear conjunctiva and lids.  HEENT: Moist mucous membranes with no oral or nasal ulcers.  Lymph: No cervical or supraclavicular lymphadenopathy.  CV: Regular rate and rhythm; no murmur/gallop/rub.  Vessels: Normal radial pulses.  Pulm: Clear to auscultation bilaterally.  Abdomen: Soft, nontender, nondistended.    Skin: No malar rash, heliotrope rash, Gottron's papules, sclerodactyly, telangiectasias, or psoriatic plaques.  MSK: No synovitis appreciated of the sternoclavicular joints, acromioclavicular joints, shoulders, elbows, knees, and ankles.  Right shoulder limited ROM abduction and FF         Assessment and Plan:  Roberta Manning is referred for further evaluation of arthralgia. She was found to have strongly positive CCP and RF.  Knee xray with CPPD and small effusion.  On exam, bilateral hands with probable synovitis mostly in the MCPs and PIPs. Her shoulder and knee pain is likely mechanical in nature.  Discussed getting hand xray to try and differentiate between Rheumatoid arthritis and Pseudogout.   Discussed one week of prednisone both for symptomatic relief and as a diagnostic tool to determine if she does have inflammation in the joints given the subtle findings on exam.   Once test/xray results are back, will decide on the most appropriate therapy.     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   Was on GLP-1 inhibitors, lost weight, and noticed her hands don't ache as much, besides some DIP joint pain-OA related.  Exam is without synovitis today, as such discussed that it's okay to hold off on DMARDs, and can use voltaren gel 3 times daily.   Patient to let me know if the pain recurs.       Recommendations:  Follow up with orthopedics for right shoulder, right hip and back DJD pain  Flexeril 10 mg at bedtime as needed for back spasms   Voltaren gel 3 times a day to the DIP joints   Consider EMG evaluation for hand throbbing   Needs UTD mammogram and colonoscopy     RCT in 3 months     Lauraine Spittle Doumeth,MD  Assistant Professor   [facility]  Division of Rheumatology  [address]  [place], [place] [id number]       Total Time Today was 40 minutes in the following activities: Preparing to see the patient, Obtaining and/or reviewing separately obtained history, Performing a medically appropriate examination and/or evaluation, Counseling and educating the patient/family/caregiver, Ordering medications, tests, or procedures, Referring and communication with other health care professionals (when not separately reported), Documenting clinical information in the electronic or other health record, and Independently interpreting results (not separately reported) and communicating results to the patient/family/caregiver

## 2024-01-18 ENCOUNTER — Encounter: Admit: 2024-01-18 | Discharge: 2024-01-18 | Payer: BLUE CROSS/BLUE SHIELD

## 2024-01-18 ENCOUNTER — Ambulatory Visit: Admit: 2024-01-18 | Discharge: 2024-01-19 | Payer: BLUE CROSS/BLUE SHIELD

## 2024-01-18 DIAGNOSIS — M1120 Other chondrocalcinosis, unspecified site: Secondary | ICD-10-CM

## 2024-01-18 DIAGNOSIS — M12811 Other specific arthropathies, not elsewhere classified, right shoulder: Secondary | ICD-10-CM

## 2024-01-18 DIAGNOSIS — S73191A Other sprain of right hip, initial encounter: Secondary | ICD-10-CM

## 2024-01-18 DIAGNOSIS — M057A Rheumatoid arthritis of other site with positive rheumatoid factor (CMS-HCC): Secondary | ICD-10-CM

## 2024-01-22 ENCOUNTER — Encounter: Admit: 2024-01-22 | Discharge: 2024-01-22 | Payer: BLUE CROSS/BLUE SHIELD

## 2024-01-22 DIAGNOSIS — E782 Mixed hyperlipidemia: Secondary | ICD-10-CM

## 2024-01-22 DIAGNOSIS — I10 Essential (primary) hypertension: Secondary | ICD-10-CM

## 2024-01-22 LAB — LIVER FUNCTION PANEL
ALBUMIN: 3.9
ALK PHOSPHATASE: 58
ALT: 25
AST: 23
DIRECT BILIRUBIN: 0.2
TOTAL BILIRUBIN: 0.6
TOTAL PROTEIN: 6.8

## 2024-01-22 LAB — LIPID PROFILE
CHOLESTEROL/HDL %: 3
HDL: 41
LDL: 69
TRIGLYCERIDES: 76
VLDL: 15

## 2024-02-01 ENCOUNTER — Encounter: Admit: 2024-02-01 | Discharge: 2024-02-01 | Payer: BLUE CROSS/BLUE SHIELD

## 2024-02-01 ENCOUNTER — Ambulatory Visit: Admit: 2024-02-01 | Discharge: 2024-02-02 | Payer: BLUE CROSS/BLUE SHIELD

## 2024-03-25 ENCOUNTER — Encounter: Admit: 2024-03-25 | Discharge: 2024-03-25 | Payer: BLUE CROSS/BLUE SHIELD

## 2024-03-25 MED ORDER — CARVEDILOL 12.5 MG PO TAB
12.5 mg | ORAL_TABLET | Freq: Two times a day (BID) | ORAL | 1 refills | 90.00000 days | Status: AC
Start: 2024-03-25 — End: ?

## 2024-08-12 ENCOUNTER — Encounter: Admit: 2024-08-12 | Discharge: 2024-08-12 | Payer: BLUE CROSS/BLUE SHIELD
